# Patient Record
Sex: Female | Born: 1965 | Hispanic: No | Marital: Married | Smoking: Never smoker
Health system: Southern US, Community
[De-identification: ages and names within clinical notes are randomized; demographics above are authoritative.]

## PROBLEM LIST (undated history)

## (undated) DIAGNOSIS — I10 Essential (primary) hypertension: Secondary | ICD-10-CM

---

## 1998-06-16 ENCOUNTER — Ambulatory Visit (HOSPITAL_COMMUNITY): Admission: RE | Admit: 1998-06-16 | Discharge: 1998-06-16 | Payer: Self-pay | Admitting: Obstetrics

## 1998-06-26 ENCOUNTER — Encounter: Admission: RE | Admit: 1998-06-26 | Discharge: 1998-06-26 | Payer: Self-pay | Admitting: Obstetrics

## 1998-06-27 ENCOUNTER — Encounter: Admission: RE | Admit: 1998-06-27 | Discharge: 1998-09-25 | Payer: Self-pay | Admitting: Obstetrics & Gynecology

## 1998-07-02 ENCOUNTER — Encounter: Admission: RE | Admit: 1998-07-02 | Discharge: 1998-07-02 | Payer: Self-pay | Admitting: Obstetrics & Gynecology

## 1998-07-16 ENCOUNTER — Encounter: Admission: RE | Admit: 1998-07-16 | Discharge: 1998-07-16 | Payer: Self-pay | Admitting: Obstetrics & Gynecology

## 1998-07-21 ENCOUNTER — Ambulatory Visit (HOSPITAL_COMMUNITY): Admission: RE | Admit: 1998-07-21 | Discharge: 1998-07-21 | Payer: Self-pay | Admitting: Obstetrics & Gynecology

## 1998-07-23 ENCOUNTER — Encounter: Admission: RE | Admit: 1998-07-23 | Discharge: 1998-07-23 | Payer: Self-pay | Admitting: Obstetrics & Gynecology

## 1998-07-23 ENCOUNTER — Encounter (HOSPITAL_COMMUNITY): Admission: RE | Admit: 1998-07-23 | Discharge: 1998-10-21 | Payer: Self-pay | Admitting: Obstetrics & Gynecology

## 1998-08-06 ENCOUNTER — Encounter: Admission: RE | Admit: 1998-08-06 | Discharge: 1998-08-06 | Payer: Self-pay | Admitting: Obstetrics & Gynecology

## 1998-08-13 ENCOUNTER — Encounter: Admission: RE | Admit: 1998-08-13 | Discharge: 1998-08-13 | Payer: Self-pay | Admitting: Obstetrics & Gynecology

## 1998-08-20 ENCOUNTER — Encounter: Admission: RE | Admit: 1998-08-20 | Discharge: 1998-08-20 | Payer: Self-pay | Admitting: Obstetrics & Gynecology

## 1998-08-27 ENCOUNTER — Encounter: Admission: RE | Admit: 1998-08-27 | Discharge: 1998-08-27 | Payer: Self-pay | Admitting: Obstetrics & Gynecology

## 1998-09-03 ENCOUNTER — Inpatient Hospital Stay (HOSPITAL_COMMUNITY): Admission: AD | Admit: 1998-09-03 | Discharge: 1998-09-03 | Payer: Self-pay | Admitting: *Deleted

## 1998-09-03 ENCOUNTER — Encounter: Admission: RE | Admit: 1998-09-03 | Discharge: 1998-09-03 | Payer: Self-pay | Admitting: Obstetrics & Gynecology

## 1998-09-09 ENCOUNTER — Inpatient Hospital Stay (HOSPITAL_COMMUNITY): Admission: AD | Admit: 1998-09-09 | Discharge: 1998-09-12 | Payer: Self-pay | Admitting: Obstetrics & Gynecology

## 2002-05-24 ENCOUNTER — Encounter: Admission: RE | Admit: 2002-05-24 | Discharge: 2002-05-24 | Payer: Self-pay | Admitting: *Deleted

## 2002-05-30 ENCOUNTER — Encounter: Admission: RE | Admit: 2002-05-30 | Discharge: 2002-08-28 | Payer: Self-pay | Admitting: *Deleted

## 2002-05-31 ENCOUNTER — Encounter: Admission: RE | Admit: 2002-05-31 | Discharge: 2002-05-31 | Payer: Self-pay | Admitting: *Deleted

## 2002-05-31 ENCOUNTER — Other Ambulatory Visit: Admission: RE | Admit: 2002-05-31 | Discharge: 2002-05-31 | Payer: Self-pay | Admitting: *Deleted

## 2002-06-06 ENCOUNTER — Ambulatory Visit (HOSPITAL_COMMUNITY): Admission: RE | Admit: 2002-06-06 | Discharge: 2002-06-06 | Payer: Self-pay | Admitting: *Deleted

## 2002-06-14 ENCOUNTER — Encounter: Admission: RE | Admit: 2002-06-14 | Discharge: 2002-06-14 | Payer: Self-pay | Admitting: *Deleted

## 2002-06-28 ENCOUNTER — Encounter: Admission: RE | Admit: 2002-06-28 | Discharge: 2002-06-28 | Payer: Self-pay | Admitting: *Deleted

## 2002-07-12 ENCOUNTER — Encounter: Admission: RE | Admit: 2002-07-12 | Discharge: 2002-07-12 | Payer: Self-pay | Admitting: *Deleted

## 2002-07-26 ENCOUNTER — Encounter: Admission: RE | Admit: 2002-07-26 | Discharge: 2002-07-26 | Payer: Self-pay | Admitting: *Deleted

## 2002-07-31 ENCOUNTER — Ambulatory Visit (HOSPITAL_COMMUNITY): Admission: RE | Admit: 2002-07-31 | Discharge: 2002-07-31 | Payer: Self-pay | Admitting: *Deleted

## 2002-08-07 ENCOUNTER — Ambulatory Visit (HOSPITAL_COMMUNITY): Admission: RE | Admit: 2002-08-07 | Discharge: 2002-08-07 | Payer: Self-pay | Admitting: *Deleted

## 2002-08-09 ENCOUNTER — Encounter: Admission: RE | Admit: 2002-08-09 | Discharge: 2002-08-09 | Payer: Self-pay | Admitting: Family Medicine

## 2002-08-23 ENCOUNTER — Encounter: Admission: RE | Admit: 2002-08-23 | Discharge: 2002-08-23 | Payer: Self-pay | Admitting: Family Medicine

## 2002-09-06 ENCOUNTER — Encounter: Admission: RE | Admit: 2002-09-06 | Discharge: 2002-09-06 | Payer: Self-pay | Admitting: Family Medicine

## 2002-09-20 ENCOUNTER — Encounter: Admission: RE | Admit: 2002-09-20 | Discharge: 2002-09-20 | Payer: Self-pay | Admitting: Family Medicine

## 2002-10-11 ENCOUNTER — Encounter: Admission: RE | Admit: 2002-10-11 | Discharge: 2002-10-11 | Payer: Self-pay | Admitting: *Deleted

## 2002-10-18 ENCOUNTER — Encounter: Admission: RE | Admit: 2002-10-18 | Discharge: 2002-10-18 | Payer: Self-pay | Admitting: *Deleted

## 2002-10-25 ENCOUNTER — Encounter: Admission: RE | Admit: 2002-10-25 | Discharge: 2002-10-25 | Payer: Self-pay | Admitting: Family Medicine

## 2002-10-25 ENCOUNTER — Ambulatory Visit (HOSPITAL_COMMUNITY): Admission: RE | Admit: 2002-10-25 | Discharge: 2002-10-25 | Payer: Self-pay | Admitting: *Deleted

## 2002-11-01 ENCOUNTER — Encounter: Admission: RE | Admit: 2002-11-01 | Discharge: 2002-11-01 | Payer: Self-pay | Admitting: Family Medicine

## 2002-11-05 ENCOUNTER — Encounter: Admission: RE | Admit: 2002-11-05 | Discharge: 2002-11-05 | Payer: Self-pay | Admitting: *Deleted

## 2002-11-09 ENCOUNTER — Encounter: Admission: RE | Admit: 2002-11-09 | Discharge: 2002-11-09 | Payer: Self-pay | Admitting: Family Medicine

## 2002-11-13 ENCOUNTER — Encounter: Admission: RE | Admit: 2002-11-13 | Discharge: 2002-11-13 | Payer: Self-pay | Admitting: *Deleted

## 2002-11-15 ENCOUNTER — Encounter: Admission: RE | Admit: 2002-11-15 | Discharge: 2002-11-15 | Payer: Self-pay | Admitting: Family Medicine

## 2002-11-19 ENCOUNTER — Encounter: Admission: RE | Admit: 2002-11-19 | Discharge: 2002-11-19 | Payer: Self-pay | Admitting: *Deleted

## 2002-11-22 ENCOUNTER — Encounter: Admission: RE | Admit: 2002-11-22 | Discharge: 2002-11-22 | Payer: Self-pay | Admitting: *Deleted

## 2002-11-26 ENCOUNTER — Encounter: Admission: RE | Admit: 2002-11-26 | Discharge: 2002-11-26 | Payer: Self-pay | Admitting: *Deleted

## 2002-11-29 ENCOUNTER — Encounter: Admission: RE | Admit: 2002-11-29 | Discharge: 2002-11-29 | Payer: Self-pay | Admitting: *Deleted

## 2002-12-03 ENCOUNTER — Encounter: Admission: RE | Admit: 2002-12-03 | Discharge: 2002-12-03 | Payer: Self-pay | Admitting: *Deleted

## 2002-12-06 ENCOUNTER — Encounter: Payer: Self-pay | Admitting: *Deleted

## 2002-12-06 ENCOUNTER — Encounter: Admission: RE | Admit: 2002-12-06 | Discharge: 2002-12-06 | Payer: Self-pay | Admitting: Family Medicine

## 2002-12-06 ENCOUNTER — Inpatient Hospital Stay (HOSPITAL_COMMUNITY): Admission: AD | Admit: 2002-12-06 | Discharge: 2002-12-10 | Payer: Self-pay | Admitting: Obstetrics & Gynecology

## 2004-01-08 ENCOUNTER — Ambulatory Visit: Payer: Self-pay | Admitting: *Deleted

## 2004-01-08 ENCOUNTER — Ambulatory Visit: Payer: Self-pay | Admitting: Family Medicine

## 2004-03-03 ENCOUNTER — Ambulatory Visit: Payer: Self-pay | Admitting: Family Medicine

## 2004-03-05 ENCOUNTER — Ambulatory Visit: Payer: Self-pay | Admitting: Family Medicine

## 2004-06-22 ENCOUNTER — Ambulatory Visit: Payer: Self-pay | Admitting: Family Medicine

## 2004-08-11 ENCOUNTER — Ambulatory Visit: Payer: Self-pay | Admitting: Family Medicine

## 2004-12-29 ENCOUNTER — Ambulatory Visit: Payer: Self-pay | Admitting: Family Medicine

## 2005-07-27 ENCOUNTER — Ambulatory Visit: Payer: Self-pay | Admitting: Family Medicine

## 2005-08-02 ENCOUNTER — Ambulatory Visit: Payer: Self-pay | Admitting: Family Medicine

## 2005-08-31 ENCOUNTER — Ambulatory Visit: Payer: Self-pay | Admitting: Family Medicine

## 2005-10-12 ENCOUNTER — Ambulatory Visit: Payer: Self-pay | Admitting: Internal Medicine

## 2005-11-16 ENCOUNTER — Ambulatory Visit: Payer: Self-pay | Admitting: Family Medicine

## 2005-12-21 ENCOUNTER — Ambulatory Visit: Payer: Self-pay | Admitting: Family Medicine

## 2006-02-09 ENCOUNTER — Ambulatory Visit: Payer: Self-pay | Admitting: Family Medicine

## 2006-02-22 ENCOUNTER — Ambulatory Visit: Payer: Self-pay | Admitting: Family Medicine

## 2006-06-21 ENCOUNTER — Ambulatory Visit: Payer: Self-pay | Admitting: Family Medicine

## 2006-06-21 ENCOUNTER — Encounter (INDEPENDENT_AMBULATORY_CARE_PROVIDER_SITE_OTHER): Payer: Self-pay | Admitting: Family Medicine

## 2006-06-21 LAB — CONVERTED CEMR LAB: Pap Smear: NORMAL

## 2006-09-13 ENCOUNTER — Ambulatory Visit: Payer: Self-pay | Admitting: Family Medicine

## 2006-10-17 ENCOUNTER — Encounter (INDEPENDENT_AMBULATORY_CARE_PROVIDER_SITE_OTHER): Payer: Self-pay | Admitting: Family Medicine

## 2006-10-17 DIAGNOSIS — E1165 Type 2 diabetes mellitus with hyperglycemia: Secondary | ICD-10-CM | POA: Insufficient documentation

## 2006-10-17 DIAGNOSIS — E119 Type 2 diabetes mellitus without complications: Secondary | ICD-10-CM | POA: Insufficient documentation

## 2006-10-17 DIAGNOSIS — E785 Hyperlipidemia, unspecified: Secondary | ICD-10-CM | POA: Insufficient documentation

## 2007-01-04 ENCOUNTER — Encounter (INDEPENDENT_AMBULATORY_CARE_PROVIDER_SITE_OTHER): Payer: Self-pay | Admitting: *Deleted

## 2007-01-27 ENCOUNTER — Encounter (INDEPENDENT_AMBULATORY_CARE_PROVIDER_SITE_OTHER): Payer: Self-pay | Admitting: Family Medicine

## 2007-01-27 ENCOUNTER — Ambulatory Visit: Payer: Self-pay | Admitting: Family Medicine

## 2007-01-27 LAB — CONVERTED CEMR LAB
Albumin: 4.1 g/dL (ref 3.5–5.2)
Alkaline Phosphatase: 76 units/L (ref 39–117)
BUN: 10 mg/dL (ref 6–23)
CO2: 24 meq/L (ref 19–32)
Calcium: 9.5 mg/dL (ref 8.4–10.5)
Chloride: 103 meq/L (ref 96–112)
Glucose, Bld: 90 mg/dL (ref 70–99)
HDL: 79 mg/dL (ref >39)
LDL Cholesterol: 92 mg/dL (ref 0–99)
Lymphocytes Relative: 40 % (ref 12–46)
Lymphs Abs: 2.8 10*3/uL (ref 0.7–3.3)
MCV: 80.6 fL (ref 78.0–100.0)
Monocytes Relative: 6 % (ref 3–11)
Neutro Abs: 3.7 10*3/uL (ref 1.7–7.7)
Neutrophils Relative %: 53 % (ref 43–77)
Platelets: 355 10*3/uL (ref 150–400)
Potassium: 4 meq/L (ref 3.5–5.3)
RBC: 4.49 M/uL (ref 3.87–5.11)
Sodium: 141 meq/L (ref 135–145)
Total Protein: 8 g/dL (ref 6.0–8.3)
Triglycerides: 80 mg/dL (ref <150)
WBC: 7 10*3/uL (ref 4.0–10.5)

## 2007-04-24 ENCOUNTER — Ambulatory Visit: Payer: Self-pay | Admitting: Family Medicine

## 2007-07-24 ENCOUNTER — Ambulatory Visit: Payer: Self-pay | Admitting: Internal Medicine

## 2007-10-02 ENCOUNTER — Ambulatory Visit: Payer: Self-pay | Admitting: Internal Medicine

## 2007-10-17 ENCOUNTER — Ambulatory Visit: Payer: Self-pay | Admitting: Internal Medicine

## 2007-10-17 ENCOUNTER — Encounter (INDEPENDENT_AMBULATORY_CARE_PROVIDER_SITE_OTHER): Payer: Self-pay | Admitting: Family Medicine

## 2007-10-17 LAB — CONVERTED CEMR LAB
ALT: 9 units/L (ref 0–35)
CO2: 22 meq/L (ref 19–32)
Calcium: 9.3 mg/dL (ref 8.4–10.5)
Chloride: 99 meq/L (ref 96–112)
Cholesterol: 184 mg/dL (ref 0–200)
Creatinine, Ser: 0.49 mg/dL (ref 0.40–1.20)
Glucose, Bld: 139 mg/dL — ABNORMAL HIGH (ref 70–99)
Microalb, Ur: 3.07 mg/dL — ABNORMAL HIGH (ref 0.00–1.89)
Sodium: 136 meq/L (ref 135–145)
Total Bilirubin: 0.2 mg/dL — ABNORMAL LOW (ref 0.3–1.2)
Total Protein: 7.6 g/dL (ref 6.0–8.3)
Triglycerides: 137 mg/dL (ref <150)

## 2008-02-14 ENCOUNTER — Encounter (INDEPENDENT_AMBULATORY_CARE_PROVIDER_SITE_OTHER): Payer: Self-pay | Admitting: Adult Health

## 2008-02-14 ENCOUNTER — Ambulatory Visit: Payer: Self-pay | Admitting: Internal Medicine

## 2008-02-14 LAB — CONVERTED CEMR LAB
BUN: 11 mg/dL (ref 6–23)
CO2: 23 meq/L (ref 19–32)
Cholesterol: 227 mg/dL — ABNORMAL HIGH (ref 0–200)
Creatinine, Ser: 0.46 mg/dL (ref 0.40–1.20)
Eosinophils Absolute: 0 10*3/uL (ref 0.0–0.7)
Eosinophils Relative: 1 % (ref 0–5)
Glucose, Bld: 175 mg/dL — ABNORMAL HIGH (ref 70–99)
HCT: 35 % — ABNORMAL LOW (ref 36.0–46.0)
HDL: 77 mg/dL (ref >39)
Hemoglobin: 11.1 g/dL — ABNORMAL LOW (ref 12.0–15.0)
Lymphocytes Relative: 43 % (ref 12–46)
Lymphs Abs: 3 10*3/uL (ref 0.7–4.0)
MCV: 79.4 fL (ref 78.0–100.0)
Monocytes Absolute: 0.5 10*3/uL (ref 0.1–1.0)
Monocytes Relative: 6 % (ref 3–12)
Total Bilirubin: 0.4 mg/dL (ref 0.3–1.2)
Total CHOL/HDL Ratio: 2.9
Total Protein: 7.7 g/dL (ref 6.0–8.3)
Triglycerides: 117 mg/dL (ref <150)
VLDL: 23 mg/dL (ref 0–40)
WBC: 7 10*3/uL (ref 4.0–10.5)

## 2008-02-22 ENCOUNTER — Ambulatory Visit: Payer: Self-pay | Admitting: Internal Medicine

## 2008-03-05 ENCOUNTER — Ambulatory Visit: Payer: Self-pay | Admitting: Internal Medicine

## 2008-03-06 ENCOUNTER — Ambulatory Visit: Payer: Self-pay | Admitting: Internal Medicine

## 2008-04-17 ENCOUNTER — Encounter (INDEPENDENT_AMBULATORY_CARE_PROVIDER_SITE_OTHER): Payer: Self-pay | Admitting: Adult Health

## 2008-04-17 ENCOUNTER — Ambulatory Visit: Payer: Self-pay | Admitting: Internal Medicine

## 2008-04-17 LAB — CONVERTED CEMR LAB
ALT: <8 units/L (ref 0–35)
Albumin: 3.6 g/dL (ref 3.5–5.2)
Basophils Absolute: 0 10*3/uL (ref 0.0–0.1)
Bilirubin Urine: NEGATIVE
CO2: 20 meq/L (ref 19–32)
Calcium: 8.8 mg/dL (ref 8.4–10.5)
Chloride: 103 meq/L (ref 96–112)
Cholesterol: 194 mg/dL (ref 0–200)
Hemoglobin: 10.4 g/dL — ABNORMAL LOW (ref 12.0–15.0)
Ketones, ur: NEGATIVE mg/dL
Lymphocytes Relative: 34 % (ref 12–46)
Neutro Abs: 3.4 10*3/uL (ref 1.7–7.7)
Platelets: 335 10*3/uL (ref 150–400)
RDW: 16 % — ABNORMAL HIGH (ref 11.5–15.5)
Sodium: 135 meq/L (ref 135–145)
Specific Gravity, Urine: 1.027 (ref 1.005–1.03)
Total Protein: 7.4 g/dL (ref 6.0–8.3)
Urobilinogen, UA: 0.2 (ref 0.0–1.0)
Vit D, 1,25-Dihydroxy: 21 — ABNORMAL LOW (ref 30–89)

## 2008-04-30 ENCOUNTER — Ambulatory Visit: Payer: Self-pay | Admitting: Internal Medicine

## 2008-05-01 ENCOUNTER — Ambulatory Visit (HOSPITAL_COMMUNITY): Admission: RE | Admit: 2008-05-01 | Discharge: 2008-05-01 | Payer: Self-pay | Admitting: Family Medicine

## 2008-05-07 ENCOUNTER — Encounter: Admission: RE | Admit: 2008-05-07 | Discharge: 2008-05-07 | Payer: Self-pay | Admitting: Family Medicine

## 2008-09-03 ENCOUNTER — Ambulatory Visit: Payer: Self-pay | Admitting: Internal Medicine

## 2008-12-03 ENCOUNTER — Ambulatory Visit: Payer: Self-pay | Admitting: Internal Medicine

## 2008-12-03 ENCOUNTER — Encounter (INDEPENDENT_AMBULATORY_CARE_PROVIDER_SITE_OTHER): Payer: Self-pay | Admitting: Adult Health

## 2008-12-03 LAB — CONVERTED CEMR LAB
AST: 12 units/L (ref 0–37)
Albumin: 4 g/dL (ref 3.5–5.2)
Alkaline Phosphatase: 73 units/L (ref 39–117)
BUN: 10 mg/dL (ref 6–23)
Potassium: 3.2 meq/L — ABNORMAL LOW (ref 3.5–5.3)
RBC / HPF: NONE SEEN (ref <3)
Sodium: 136 meq/L (ref 135–145)
Total Protein: 7.6 g/dL (ref 6.0–8.3)

## 2008-12-04 ENCOUNTER — Encounter (INDEPENDENT_AMBULATORY_CARE_PROVIDER_SITE_OTHER): Payer: Self-pay | Admitting: Adult Health

## 2008-12-06 ENCOUNTER — Encounter: Admission: RE | Admit: 2008-12-06 | Discharge: 2008-12-06 | Payer: Self-pay | Admitting: Internal Medicine

## 2009-03-12 ENCOUNTER — Ambulatory Visit: Payer: Self-pay | Admitting: Internal Medicine

## 2009-03-12 ENCOUNTER — Encounter (INDEPENDENT_AMBULATORY_CARE_PROVIDER_SITE_OTHER): Payer: Self-pay | Admitting: Adult Health

## 2009-03-12 LAB — CONVERTED CEMR LAB
Albumin: 4.3 g/dL (ref 3.5–5.2)
Alkaline Phosphatase: 80 units/L (ref 39–117)
BUN: 10 mg/dL (ref 6–23)
Cholesterol: 217 mg/dL — ABNORMAL HIGH (ref 0–200)
Glucose, Bld: 185 mg/dL — ABNORMAL HIGH (ref 70–99)
HDL: 70 mg/dL (ref >39)
LDL Cholesterol: 116 mg/dL — ABNORMAL HIGH (ref 0–99)
Potassium: 4.1 meq/L (ref 3.5–5.3)
Triglycerides: 157 mg/dL — ABNORMAL HIGH (ref <150)

## 2009-05-23 ENCOUNTER — Encounter: Admission: RE | Admit: 2009-05-23 | Discharge: 2009-05-23 | Payer: Self-pay | Admitting: Internal Medicine

## 2009-06-18 ENCOUNTER — Ambulatory Visit: Payer: Self-pay | Admitting: Family Medicine

## 2009-06-18 ENCOUNTER — Encounter (INDEPENDENT_AMBULATORY_CARE_PROVIDER_SITE_OTHER): Payer: Self-pay | Admitting: Adult Health

## 2009-06-18 LAB — CONVERTED CEMR LAB
AST: 12 units/L (ref 0–37)
Alkaline Phosphatase: 80 units/L (ref 39–117)
BUN: 11 mg/dL (ref 6–23)
Basophils Absolute: 0 10*3/uL (ref 0.0–0.1)
Basophils Relative: 0 % (ref 0–1)
Creatinine, Ser: 0.41 mg/dL (ref 0.40–1.20)
Eosinophils Absolute: 0 10*3/uL (ref 0.0–0.7)
Eosinophils Relative: 0 % (ref 0–5)
Glucose, Bld: 91 mg/dL (ref 70–99)
HCT: 40.5 % (ref 36.0–46.0)
HDL: 67 mg/dL (ref >39)
LDL Cholesterol: 60 mg/dL (ref 0–99)
Lymphocytes Relative: 28 % (ref 12–46)
MCHC: 32.3 g/dL (ref 30.0–36.0)
MCV: 84.4 fL (ref 78.0–100.0)
Platelets: 336 10*3/uL (ref 150–400)
RDW: 13.1 % (ref 11.5–15.5)
Total CHOL/HDL Ratio: 2.4
Triglycerides: 158 mg/dL — ABNORMAL HIGH (ref <150)

## 2009-07-09 ENCOUNTER — Ambulatory Visit: Payer: Self-pay | Admitting: Internal Medicine

## 2009-07-30 ENCOUNTER — Ambulatory Visit: Payer: Self-pay | Admitting: Internal Medicine

## 2009-10-29 ENCOUNTER — Ambulatory Visit: Payer: Self-pay | Admitting: Internal Medicine

## 2009-10-29 ENCOUNTER — Encounter (INDEPENDENT_AMBULATORY_CARE_PROVIDER_SITE_OTHER): Payer: Self-pay | Admitting: Adult Health

## 2009-10-29 LAB — CONVERTED CEMR LAB: Microalb, Ur: 2.95 mg/dL — ABNORMAL HIGH (ref 0.00–1.89)

## 2009-11-28 ENCOUNTER — Encounter: Admission: RE | Admit: 2009-11-28 | Discharge: 2009-11-28 | Payer: Self-pay | Admitting: Internal Medicine

## 2010-01-17 ENCOUNTER — Emergency Department (HOSPITAL_COMMUNITY): Admission: EM | Admit: 2010-01-17 | Discharge: 2010-01-17 | Payer: Self-pay | Admitting: Emergency Medicine

## 2010-05-09 ENCOUNTER — Other Ambulatory Visit: Payer: Self-pay | Admitting: Internal Medicine

## 2010-05-09 DIAGNOSIS — N6320 Unspecified lump in the left breast, unspecified quadrant: Secondary | ICD-10-CM

## 2010-05-26 ENCOUNTER — Ambulatory Visit
Admission: RE | Admit: 2010-05-26 | Discharge: 2010-05-26 | Disposition: A | Discharge status: Home / Self Care | Payer: Medicaid Other | Source: Ambulatory Visit | Attending: Internal Medicine | Admitting: Internal Medicine

## 2010-05-26 DIAGNOSIS — N6320 Unspecified lump in the left breast, unspecified quadrant: Secondary | ICD-10-CM

## 2010-06-09 ENCOUNTER — Emergency Department (HOSPITAL_COMMUNITY)
Admission: EM | Admit: 2010-06-09 | Discharge: 2010-06-10 | Disposition: A | Discharge status: Home / Self Care | Payer: Medicaid Other | Attending: Emergency Medicine | Admitting: Emergency Medicine

## 2010-06-09 DIAGNOSIS — R509 Fever, unspecified: Secondary | ICD-10-CM | POA: Insufficient documentation

## 2010-06-09 DIAGNOSIS — Z79899 Other long term (current) drug therapy: Secondary | ICD-10-CM | POA: Insufficient documentation

## 2010-06-09 DIAGNOSIS — R109 Unspecified abdominal pain: Secondary | ICD-10-CM | POA: Insufficient documentation

## 2010-06-09 DIAGNOSIS — E119 Type 2 diabetes mellitus without complications: Secondary | ICD-10-CM | POA: Insufficient documentation

## 2010-06-09 DIAGNOSIS — N12 Tubulo-interstitial nephritis, not specified as acute or chronic: Secondary | ICD-10-CM | POA: Insufficient documentation

## 2010-06-09 DIAGNOSIS — Z794 Long term (current) use of insulin: Secondary | ICD-10-CM | POA: Insufficient documentation

## 2010-06-09 DIAGNOSIS — I10 Essential (primary) hypertension: Secondary | ICD-10-CM | POA: Insufficient documentation

## 2010-06-09 DIAGNOSIS — E7800 Pure hypercholesterolemia, unspecified: Secondary | ICD-10-CM | POA: Insufficient documentation

## 2010-06-09 LAB — DIFFERENTIAL
Basophils Absolute: 0 K/uL (ref 0.0–0.1)
Basophils Relative: 0 % (ref 0–1)
Eosinophils Absolute: 0 K/uL (ref 0.0–0.7)
Eosinophils Relative: 0 % (ref 0–5)
Lymphocytes Relative: 17 % (ref 12–46)
Lymphs Abs: 1.8 K/uL (ref 0.7–4.0)
Monocytes Absolute: 0.9 K/uL (ref 0.1–1.0)
Monocytes Relative: 8 % (ref 3–12)
Neutro Abs: 8.3 K/uL — ABNORMAL HIGH (ref 1.7–7.7)
Neutrophils Relative %: 75 % (ref 43–77)

## 2010-06-09 LAB — CBC
HCT: 39 % (ref 36.0–46.0)
MCHC: 33.1 g/dL (ref 30.0–36.0)
MCV: 84.4 fL (ref 78.0–100.0)
Platelets: 253 10*3/uL (ref 150–400)
RDW: 12.7 % (ref 11.5–15.5)
WBC: 11 10*3/uL — ABNORMAL HIGH (ref 4.0–10.5)

## 2010-06-09 LAB — URINALYSIS, ROUTINE W REFLEX MICROSCOPIC
Bilirubin Urine: NEGATIVE
Nitrite: NEGATIVE
Protein, ur: 100 mg/dL — AB
Specific Gravity, Urine: 1.012 (ref 1.005–1.030)
Urine Glucose, Fasting: NEGATIVE mg/dL
Urobilinogen, UA: 1 mg/dL (ref 0.0–1.0)
pH: 6.5 (ref 5.0–8.0)

## 2010-06-09 LAB — COMPREHENSIVE METABOLIC PANEL
Albumin: 3.5 g/dL (ref 3.5–5.2)
Alkaline Phosphatase: 81 U/L (ref 39–117)
BUN: 6 mg/dL (ref 6–23)
Calcium: 9.1 mg/dL (ref 8.4–10.5)
Glucose, Bld: 95 mg/dL (ref 70–99)
Potassium: 3.5 mEq/L (ref 3.5–5.1)
Total Protein: 8 g/dL (ref 6.0–8.3)

## 2010-06-09 LAB — POCT PREGNANCY, URINE: Preg Test, Ur: NEGATIVE

## 2010-06-09 LAB — URINE MICROSCOPIC-ADD ON

## 2010-06-11 LAB — URINE CULTURE: Colony Count: >=100000

## 2011-06-01 ENCOUNTER — Other Ambulatory Visit: Payer: Self-pay | Admitting: Diagnostic Radiology

## 2011-06-01 DIAGNOSIS — Z1231 Encounter for screening mammogram for malignant neoplasm of breast: Secondary | ICD-10-CM

## 2011-06-30 ENCOUNTER — Ambulatory Visit
Admission: RE | Admit: 2011-06-30 | Discharge: 2011-06-30 | Disposition: A | Discharge status: Home / Self Care | Payer: Self-pay | Source: Ambulatory Visit | Attending: Diagnostic Radiology | Admitting: Diagnostic Radiology

## 2011-06-30 DIAGNOSIS — Z1231 Encounter for screening mammogram for malignant neoplasm of breast: Secondary | ICD-10-CM

## 2011-07-06 ENCOUNTER — Encounter (HOSPITAL_COMMUNITY): Payer: Self-pay

## 2011-07-06 ENCOUNTER — Emergency Department (INDEPENDENT_AMBULATORY_CARE_PROVIDER_SITE_OTHER)
Admission: EM | Admit: 2011-07-06 | Discharge: 2011-07-06 | Disposition: A | Discharge status: Home / Self Care | Payer: Self-pay | Source: Home / Self Care | Attending: Emergency Medicine | Admitting: Emergency Medicine

## 2011-07-06 DIAGNOSIS — G510 Bell's palsy: Secondary | ICD-10-CM

## 2011-07-06 MED ORDER — PREDNISONE 5 MG PO KIT: 1.0000 | PACK | Freq: Every day | ORAL | Status: DC

## 2011-07-06 MED ORDER — ACYCLOVIR 400 MG PO TABS: 800.0000 mg | ORAL_TABLET | ORAL | Status: AC

## 2011-07-06 MED ORDER — POLYETHYL GLYCOL-PROPYL GLYCOL 0.4-0.3 % OP SOLN: 1.0000 [drp] | OPHTHALMIC | Status: DC

## 2011-07-06 MED ORDER — ERYTHROMYCIN 5 MG/GM OP OINT: TOPICAL_OINTMENT | OPHTHALMIC | Status: AC

## 2011-07-06 NOTE — ED Provider Notes (Signed)
Chief Complaint  Patient presents with  . Mouth Injury    History of Present Illness:   The patient is a 46 year old female with diabetes and hypertension. Yesterday she noted that her left eye was tearing. Today she noted the right corner of her mouth was drawn. She denies any pain in her face, numbness, tingling, diplopia, or blurred vision she has no pain in the ear or behind the ear. She is not aware of any weakness of the left side of the face. She denies any eye pain or redness. She has no numbness, tingling, or weakness of her arms or legs. Her speech is normal and she is able to ambulate normally. She denies any headache or stiff neck.  Review of Systems:  Other than noted above, the patient denies any of the following symptoms: Systemic:  No fever, chills, fatigue, photophobia, stiff neck. Eye:  No redness, eye pain, discharge, blurred vision, or diplopia. ENT:  No nasal congestion, rhinorrhea, sinus pressure or pain, sneezing, earache, or sore throat.  No jaw claudication. Neuro:  No paresthesias, loss of consciousness, seizure activity, muscle weakness, trouble with coordination or gait, trouble speaking or swallowing. Psych:  No depression, anxiety or trouble sleeping.  PMFSH:  Past medical history, family history, social history, meds, and allergies were reviewed.  Physical Exam:   Vital signs:  BP 128/75  Pulse 99  Temp(Src) 98.9 F (37.2 C) (Oral)  Resp 16  SpO2 97% General:  Alert and oriented.  In no distress. Eye:  Lids and conjunctivas normal.  PERRL,  Full EOMs.  Fundi benign with normal discs and vessels. ENT:  No cranial or facial tenderness to palpation.  TMs and canals clear.  Nasal mucosa was normal and uncongested without any drainage. No intra oral lesions, pharynx clear, mucous membranes moist, dentition normal. Neck:  Supple, full ROM, no tenderness to palpation.  No adenopathy or mass. Neuro:  Alert and orented times 3.  Speech was clear, fluent, and  appropriate.  Cranial nerve exam reveals mild weakness of the left facial nerve. She is unable to elevate her eyebrow. She is able to completely close her eye, but strength of the orbicularis oculi muscle is diminished. She has drooping of the left corner of the mouth. No pronator drift, muscle strength normal. Finger to nose normal.  DTRs 2+ .Station and gait were normal.  Romberg's sign was normal.  Able to perform tandem gait well. Psych:  Normal affect.  Assessment:   Diagnoses that have been ruled out:  None  Diagnoses that are still under consideration:  None  Final diagnoses:  Bell's palsy    Plan:   1.  The following meds were prescribed:   New Prescriptions   ACYCLOVIR (ZOVIRAX) 400 MG TABLET    Take 2 tablets (800 mg total) by mouth every 4 (four) hours while awake.   ERYTHROMYCIN OPHTHALMIC OINTMENT    Place a 1/2 inch ribbon of ointment into the lower eyelid at HS.   POLYETHYL GLYCOL-PROPYL GLYCOL (SYSTANE) 0.4-0.3 % SOLN    Apply 1 drop to eye every 3 (three) hours.   PREDNISONE 5 MG KIT    Take 1 kit (5 mg total) by mouth daily after breakfast. Prednisone 5 mg 6 day dosepack.  Take as directed.   2.  The patient was instructed in symptomatic care and handouts were given. 3.  The patient was told to return if becoming worse in any way, if no better in 3 or 4 days, and given  some red flag symptoms that would indicate earlier return.  Follow up:  The patient was told to follow up either here or with [facility] in one week. She was warned that the prednisone might cause her glucose to go up transiently.     Reuben Likes, MD 07/06/11 1245

## 2011-07-06 NOTE — Discharge Instructions (Signed)
Bell's Palsy  Bell's palsy is a condition in which the muscles on one side of the face cannot move (paralysis). This is because the nerves in the face are paralyzed. It is most often thought to be caused by a virus. The virus causes swelling of the nerve that controls movement on one side of the face. The nerve travels through a tight space surrounded by bone. When the nerve swells, it can be compressed by the bone. This results in damage to the protective covering around the nerve. This damage interferes with how the nerve communicates with the muscles of the face. As a result, it can cause weakness or paralysis of the facial muscles.   Injury (trauma), tumor, and surgery may cause Bell's palsy, but most of the time the cause is unknown. It is a relatively common condition. It starts suddenly (abrupt onset) with the paralysis usually ending within 2 days. Bell's palsy is not dangerous. But because the eye does not close properly, you may need care to keep the eye from getting dry. This can include splinting (to keep the eye shut) or moistening with artificial tears. Bell's palsy very seldom occurs on both sides of the face at the same time.  SYMPTOMS    Eyebrow sagging.   Drooping of the eyelid and corner of the mouth.   Inability to close one eye.   Loss of taste on the front of the tongue.   Sensitivity to loud noises.  TREATMENT   The treatment is usually non-surgical. If the patient is seen within the first 24 to 48 hours, a short course of steroids may be prescribed, in an attempt to shorten the length of the condition. Antiviral medicines may also be used with the steroids, but it is unclear if they are helpful.   You will need to protect your eye, if you cannot close it. The cornea (clear covering over your eye) will become dry and can be damaged. Artificial tears can be used to keep your eye moist. Glasses or an eye patch should be worn to protect your eye.  PROGNOSIS   Recovery is variable, ranging  from days to months. Although the problem usually goes away completely (about 80% of cases resolve), predicting the outcome is impossible. Most people improve within 3 weeks of when the symptoms began. Improvement may continue for 3 to 6 months. A small number of people have moderate to severe weakness that is permanent.   HOME CARE INSTRUCTIONS    If your caregiver prescribed medication to reduce swelling in the nerve, use as directed. Do not stop taking the medication unless directed by your caregiver.   Use moisturizing eye drops as needed to prevent drying of your eye, as directed by your caregiver.   Protect your eye, as directed by your caregiver.   Use facial massage and exercises, as directed by your caregiver.   Perform your normal activities, and get your normal rest.  SEEK IMMEDIATE MEDICAL CARE IF:    There is pain, redness or irritation in the eye.   You or your child has an oral temperature above 102 F (38.9 C), not controlled by medicine.  MAKE SURE YOU:    Understand these instructions.   Will watch your condition.   Will get help right away if you are not doing well or get worse.  Document Released: 04/05/2005 Document Revised: 03/25/2011 Document Reviewed: 04/14/2009  [employer] Patient Information 2012 [employer].

## 2011-07-06 NOTE — ED Notes (Addendum)
Patient c/o of shifting of jaw on right side which started 3/18 while she was brushing her teeth. Patient also states that her left eye is draining clear fluid; when patient wakes up her right is crusted shut.on 3/18 . Note by Corey Skains, RN

## 2011-07-12 ENCOUNTER — Emergency Department (INDEPENDENT_AMBULATORY_CARE_PROVIDER_SITE_OTHER)
Admission: EM | Admit: 2011-07-12 | Discharge: 2011-07-12 | Disposition: A | Discharge status: Home / Self Care | Payer: Self-pay | Source: Home / Self Care | Attending: Emergency Medicine | Admitting: Emergency Medicine

## 2011-07-12 ENCOUNTER — Encounter (HOSPITAL_COMMUNITY): Payer: Self-pay | Admitting: Emergency Medicine

## 2011-07-12 DIAGNOSIS — G510 Bell's palsy: Secondary | ICD-10-CM

## 2011-07-12 HISTORY — DX: Essential (primary) hypertension: I10

## 2011-07-12 NOTE — Discharge Instructions (Signed)
Continue taking the acyclovir. Continue the Systane and the erythromycin eye ointment at night to prevent her from drying out. You will need to see a neurologist within the next week or 2, as you may need physical therapy to regain full function. Most often, this takes 3 weeks to resolve. Sometimes people have residual deficits, but most get better with time. There is a small chance that this may come back within the next 10 years. Return if you get worse, if you have trouble talking, walking, a headache, or any other concerns.

## 2011-07-12 NOTE — ED Notes (Signed)
Medical follow-up request form received from Dr. Chaney Malling for pt. to be seen by [facility] for Bell's Palsy. Pt. finished medication and still moderate dysfuntion.  She thinks pt. might benefit from PT.  Chart and referral request faxed to [phone] and confirmation received. Karla Mack 07/12/2011

## 2011-07-12 NOTE — ED Provider Notes (Signed)
History     CSN: [id number]  Arrival date & time 07/12/11  1032   First MD Initiated Contact with Patient 07/12/11 1211      Chief Complaint  Patient presents with  . Follow-up    (Consider location/radiation/quality/duration/timing/severity/associated sxs/prior treatment) HPI Comments: Patient was seen here 6 days ago for symptoms consistent with Bell's palsy. Was sent home with a acyclovir, Systane, erythromycin ophthalmic ointment, and prednisone taper. She was told to followup with [facility] or here. Patient states that the symptoms have not gotten any worse, but they have not gotten any better, even though she finished the steroids, and is taking acyclovir. States she is using Systane, and the erythromycin ophthalmic ointment at night to prevent drying of her eye. Patient complains of continued  inability to raise her left eyebrow, inability to smile on the left side. No facial numbness, dysarthria, ear pain, change in hearing, facial rash, blurry vision, eye pain or redness headache, fevers, stiff neck. No arm, leg weakness. Does not recall a tick bite, and denies history of syphilis. The spouse interpreted for the patient.  ROS as noted in HPI. All other ROS negative.  The history is provided by the spouse and the patient. The history is limited by a language barrier. A language interpreter was used.    Past Medical History  Diagnosis Date  . Diabetes mellitus   . Hypertension     History reviewed. No pertinent past surgical history.  History reviewed. No pertinent family history.  History  Substance Use Topics  . Smoking status: Never Smoker   . Smokeless tobacco: Not on file  . Alcohol Use: No    OB History    Grav Para Term Preterm Abortions TAB SAB Ect Mult Living                  Review of Systems  Allergies  Seasonal  Home Medications   Current Outpatient Rx  Name Route Sig Dispense Refill  . ACYCLOVIR 400 MG PO TABS Oral Take 2 tablets (800 mg  total) by mouth every 4 (four) hours while awake. 100 tablet 0  . AMLODIPINE BESYLATE 10 MG PO TABS Oral Take 10 mg by mouth daily.    . ASPIRIN 81 MG PO TABS Oral Take 81 mg by mouth daily.    Marland Kitchen CETIRIZINE HCL 10 MG PO TABS Oral Take 10 mg by mouth daily.    . ERGOCALCIFEROL 50000 UNITS PO CAPS Oral Take 50,000 Units by mouth once a week.    . ERYTHROMYCIN 5 MG/GM OP OINT  Place a 1/2 inch ribbon of ointment into the lower eyelid at HS. 3.5 g 0  . HYDROXYZINE HCL 25 MG PO TABS Oral Take 25 mg by mouth 3 (three) times daily as needed.    . INSULIN ASPART 100 UNIT/ML [place] SOLN Subcutaneous Inject into the skin 3 (three) times daily before meals.    Marland Kitchen METFORMIN HCL 1000 MG PO TABS Oral Take 1,000 mg by mouth 2 (two) times daily with a meal.    . POLYETHYL GLYCOL-PROPYL GLYCOL 0.4-0.3 % OP SOLN Ophthalmic Apply 1 drop to eye every 3 (three) hours. 10 mL 0  . PRAVASTATIN SODIUM 40 MG PO TABS Oral Take 40 mg by mouth daily.    Marland Kitchen PREDNISONE 5 MG PO KIT Oral Take 1 kit (5 mg total) by mouth daily after breakfast. Prednisone 5 mg 6 day dosepack.  Take as directed. 1 kit 0    BP 132/76  Pulse 87  Temp(Src) 99.4 F (37.4 C) (Oral)  Resp 14  SpO2 100%  LMP 07/10/2011  Physical Exam  Nursing note and vitals reviewed. Constitutional: She is oriented to person, place, and time. She appears well-developed and well-nourished. No distress.  HENT:  Head: Normocephalic and atraumatic.  Eyes: Conjunctivae and EOM are normal.  Neck: Normal range of motion.  Cardiovascular: Normal rate.   Pulmonary/Chest: Effort normal.  Abdominal: She exhibits no distension.  Musculoskeletal: Normal range of motion.  Neurological: She is alert and oriented to person, place, and time. A cranial nerve deficit is present. No sensory deficit. She displays a negative Romberg sign.       Forehead: Moderate movement. Able to close eye completely with effort. Left-sided mouth: Slightly weak but maximum effort. No tongue  deviation. Rest of cranial nerves within normal limits. Strength upper, lower extremities 5 out of 5 bilaterally. Tandem gait steady. Romberg normal.  Skin: Skin is warm and dry.  Psychiatric: She has a normal mood and affect. Her behavior is normal. Judgment and thought content normal.    ED Course  Procedures (including critical care time)  Labs Reviewed - No data to display No results found.   1. Bell's palsy       MDM  Previous records viewed. As noted in history of present illness. Patient has grade 3 indicating moderate dysfunction on the House-Brackman classification scale. Discussed with patient that this may take up to 3 weeks to fully resolve, however, will refer her to neurology so that she can have physical therapy, if needed. Will have her continue the Systane, erythromycin eye ointment, and the acyclovir. Discussed plan with patient and spouse, who agree with plan. As patient has no [place], will have Cherly Anderson assist in making referral. [facility] on call.  Luiz Blare, MD 07/12/11 [phone]

## 2011-07-12 NOTE — ED Notes (Signed)
Pt. Speaks Arabic. Family at bedside interpreting. Pt. Here for follow up on dx of Bells Palsy last week. Pt. C/o that therapy has not helped yet. Denies pain.

## 2011-07-19 ENCOUNTER — Ambulatory Visit (INDEPENDENT_AMBULATORY_CARE_PROVIDER_SITE_OTHER): Payer: Self-pay | Admitting: Neurology

## 2011-07-19 ENCOUNTER — Encounter: Payer: Self-pay | Admitting: Neurology

## 2011-07-19 ENCOUNTER — Other Ambulatory Visit (INDEPENDENT_AMBULATORY_CARE_PROVIDER_SITE_OTHER): Payer: Self-pay

## 2011-07-19 VITALS — BP 128/84 | HR 96 | Ht 65.0 in | Wt 161.0 lb

## 2011-07-19 DIAGNOSIS — G510 Bell's palsy: Secondary | ICD-10-CM

## 2011-07-19 LAB — CBC WITH DIFFERENTIAL/PLATELET
Basophils Absolute: 0 10*3/uL (ref 0.0–0.1)
Eosinophils Absolute: 0 10*3/uL (ref 0.0–0.7)
HCT: 37.8 % (ref 36.0–46.0)
Hemoglobin: 12.4 g/dL (ref 12.0–15.0)
Lymphocytes Relative: 32 % (ref 12.0–46.0)
Lymphs Abs: 2.3 10*3/uL (ref 0.7–4.0)
MCHC: 32.8 g/dL (ref 30.0–36.0)
Monocytes Absolute: 0.5 10*3/uL (ref 0.1–1.0)
Neutro Abs: 4.4 10*3/uL (ref 1.4–7.7)
RDW: 14.9 % — ABNORMAL HIGH (ref 11.5–14.6)

## 2011-07-19 LAB — COMPREHENSIVE METABOLIC PANEL
ALT: 17 U/L (ref 0–35)
AST: 20 U/L (ref 0–37)
Alkaline Phosphatase: 87 U/L (ref 39–117)
Creatinine, Ser: 0.4 mg/dL (ref 0.4–1.2)
Total Bilirubin: 0.2 mg/dL — ABNORMAL LOW (ref 0.3–1.2)

## 2011-07-19 NOTE — Patient Instructions (Addendum)
Go to the basement to have your labs drawn today.  You can try some over the counter hydrocortisone cream for your rash, if it does not help please contact your primary care doctor.

## 2011-07-19 NOTE — Progress Notes (Signed)
Thank you for having me see Karla Mack in consultation today at [facility] for her problem with left-sided facial paresis.  As you may recall, she is a 46 y.o. year old female with a history of diabetes and hypertension who presented 3 weeks ago with both upper and lower facial paresis on the left.  It came on over night(although our history is limited by translation through her husband). It was felt that this represented Bell's palsy. She was given a course of steroids and acyclovir of which she is still on the acyclovir. She's had a marked improvement. She does not endorse any eye irritation. She is able to close her eye fully. She's had no fevers chills or night sweats. There is no rash on her face. She had no disturbance of hearing or taste.  She has been using erythromycin ointment at night as well as eye drops during the day.  She did not have any pain with this episode. There was no double vision.  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 The actual history this is a little bit difficult to obtain. The patient says she was born with it while her husband said she got it from an immunization at 68 months of age.  Surgical History: No surgeries  Social History: No tobacco or alcohol use. She is originally from [place].  Family History: No significant neurologic family history  Medications: Acyclovir, amlodipine, aspirin, metformin, insulin, pravastatin.  Allergies  Allergen Reactions  . Seasonal     Itchy eyes, runny nose, sneezing      Review of systems:  13 systems were reviewed and are notable for right leg weakness and atrophy.  All other review of systems are unremarkable.   Examination:  Filed Vitals:   07/19/11 1320  BP: 128/84  Pulse: 96  Height: 5\' 5"  (1.651 m)  Weight: 161 lb (73.029 kg)     In general, well appearing women  Cardiovascular: The patient has a regular rate and rhythm and no carotid  bruits.  Fundoscopy:  Disks are flat. Vessel caliber within normal limits.  Mental status:   The patient is oriented to person, place and time. Recent and remote memory are intact. Attention span and concentration are normal. Language including repetition, naming, following commands are intact. Fund of knowledge of current and historical events, as well as vocabulary are normal.  Cranial Nerves: Pupils are equally round and reactive to light. Visual fields full to confrontation. Extraocular movements are intact without nystagmus, versions full. Facial sensation and muscles of mastication are intact. Mild weakness of her left lower face.  Can close her left eye, but not ask brisk as the right. Hearing intact to bilateral finger rub. Tongue protrusion, uvula, palate midline.  Shoulder shrug intact  Motor:  The patient has atrophy of her right lower leg. There are no adventitious movements.  5/5 muscle strength bilaterally.  Reflexes:   Biceps  Triceps Brachioradialis Knee Ankle  Right 1+  1+  1+   0 0  Left  1+  1+  1+   0 0  Right toe up, left toe down(or equivocal).  Coordination:  Normal finger to nose.  No dysdiadokinesia.  Sensation is intact to temperature.  Gait and Station are notable for outturned ankle, especially on the right..   Romberg is negative  Impression and Recommendations: 1.  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  No sign of parenchymal involvement on  exam(no sign of a sixth nerve palsy).  No symptoms of systemic illness.  The improvement and definite signs of LMN make me less concerned about a possible central ischemic cause, although I would feel more comfortable if I had a better history.  I am going to get blood work, including CBC, CMP, Lyme, HIV, ESR and CRP.  She can stop the acyclovir but continue to use the eye drops PRN.  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  I am also going to get an MRI brain to assess her brainstem particularly for  ischemic stroke, given her history of diabetes - especially since she has an upgoing toe on the right(which may be old). 2.  Right lower leg atrophy and up going toe on the same side.  I suspect these are related to her congenital paresis.  We will see the patient back in 2 months.  Thank you for having [place] see Karla Mack in consultation.  Feel free to contact me with any questions.  Lupita Raider Modesto Charon, MD [facility], [place] [address] [place], [place] [address] Phone: [phone] Fax: [phone].

## 2011-07-21 ENCOUNTER — Other Ambulatory Visit: Payer: Self-pay

## 2011-07-21 DIAGNOSIS — G510 Bell's palsy: Secondary | ICD-10-CM

## 2011-07-21 LAB — B. BURGDORFI ANTIBODIES BY WB
B burgdorferi IgG Abs (IB): NEGATIVE
B burgdorferi IgM Abs (IB): NEGATIVE

## 2011-07-26 ENCOUNTER — Ambulatory Visit (HOSPITAL_COMMUNITY)
Admission: RE | Admit: 2011-07-26 | Discharge: 2011-07-26 | Disposition: A | Discharge status: Home / Self Care | Payer: Self-pay | Source: Ambulatory Visit | Attending: Neurology | Admitting: Neurology

## 2011-07-26 DIAGNOSIS — G510 Bell's palsy: Secondary | ICD-10-CM | POA: Insufficient documentation

## 2011-07-30 ENCOUNTER — Telehealth: Payer: Self-pay | Admitting: Neurology

## 2011-07-30 NOTE — Progress Notes (Signed)
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  No stroke in the brainstem, thus supporting this is a Bell's palsy.

## 2011-07-30 NOTE — Telephone Encounter (Signed)
Message copied by Benay Spice on Fri Jul 30, 2011 12:06 PM ------      Message from: Milas Gain      Created: Fri Jul 30, 2011 11:17 AM       let ms faynoush know her mri looked ok.

## 2011-07-30 NOTE — Telephone Encounter (Signed)
Called and spoke with the patient. Information given as directed by Dr. Modesto Charon re: MRI brian unremarkable. No other concerns voiced at this time.

## 2011-08-14 IMAGING — US US BREAST*L*
1 series · 5 of 5 positions shown · non-contrast
Comparison: With priors

CLINICAL DATA: Short-term interval follow-up of a probable benign
lesion in the left breast

DIGITAL DIAGNOSTIC BILATERAL MAMMOGRAM WITH CAD AND LEFT BREAST
ULTRASOUND:

[Series 1: us breast*left* · 5 of 5 slices shown]
[im 1/5]
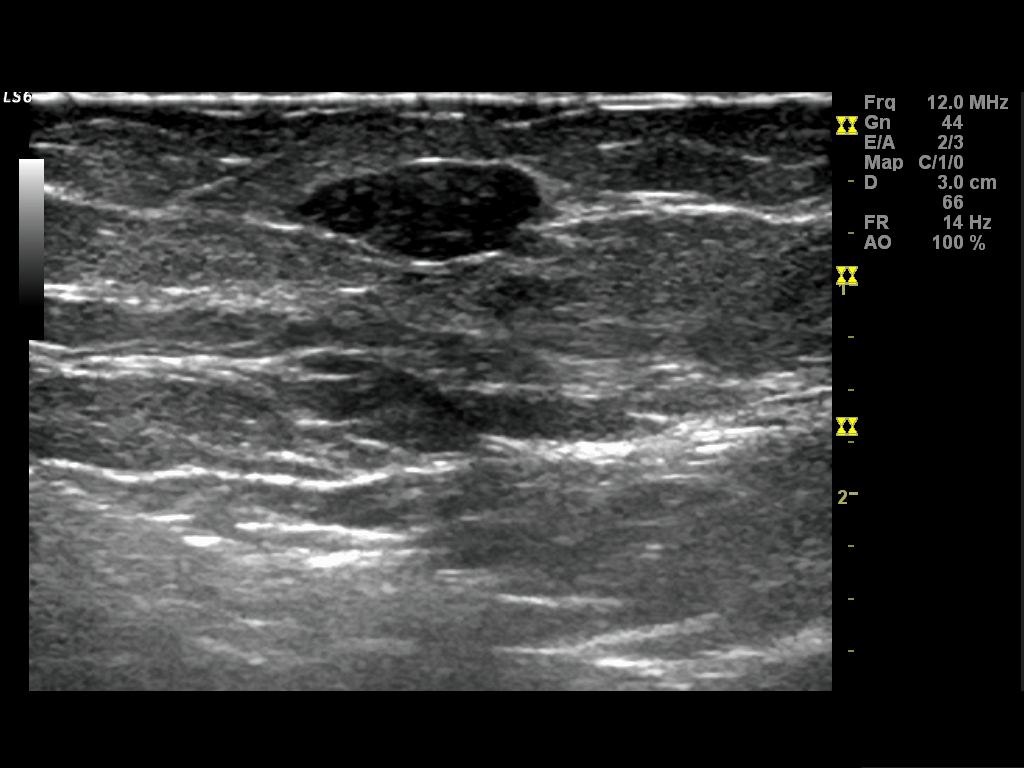
[im 2/5]
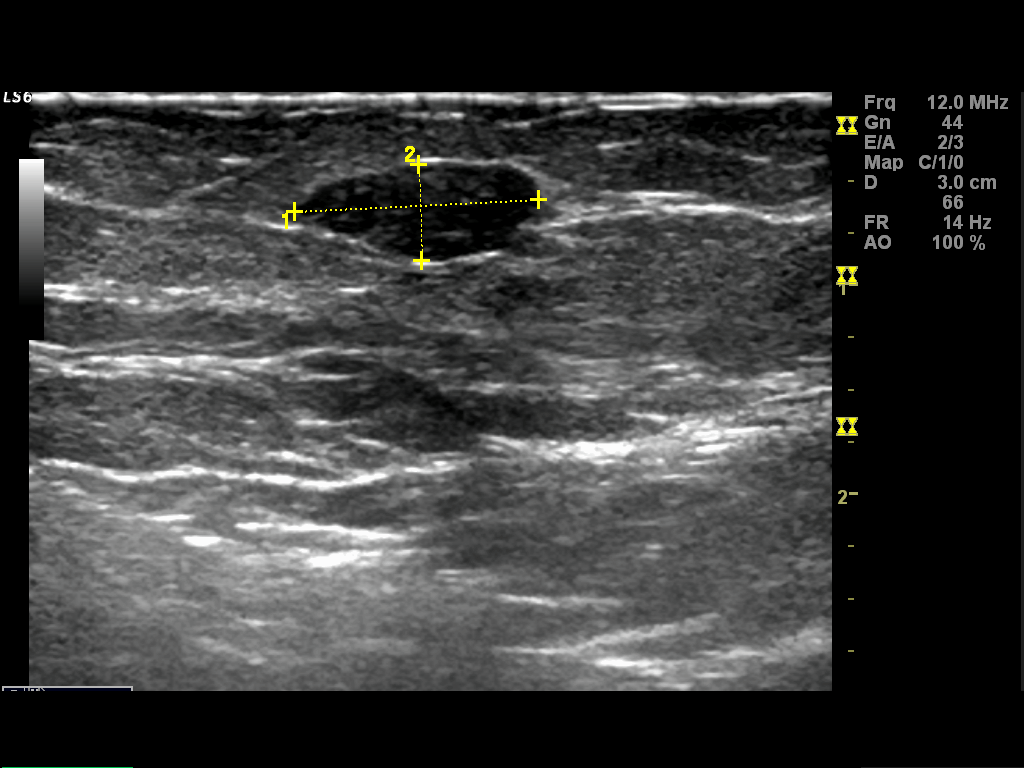
[im 3/5]
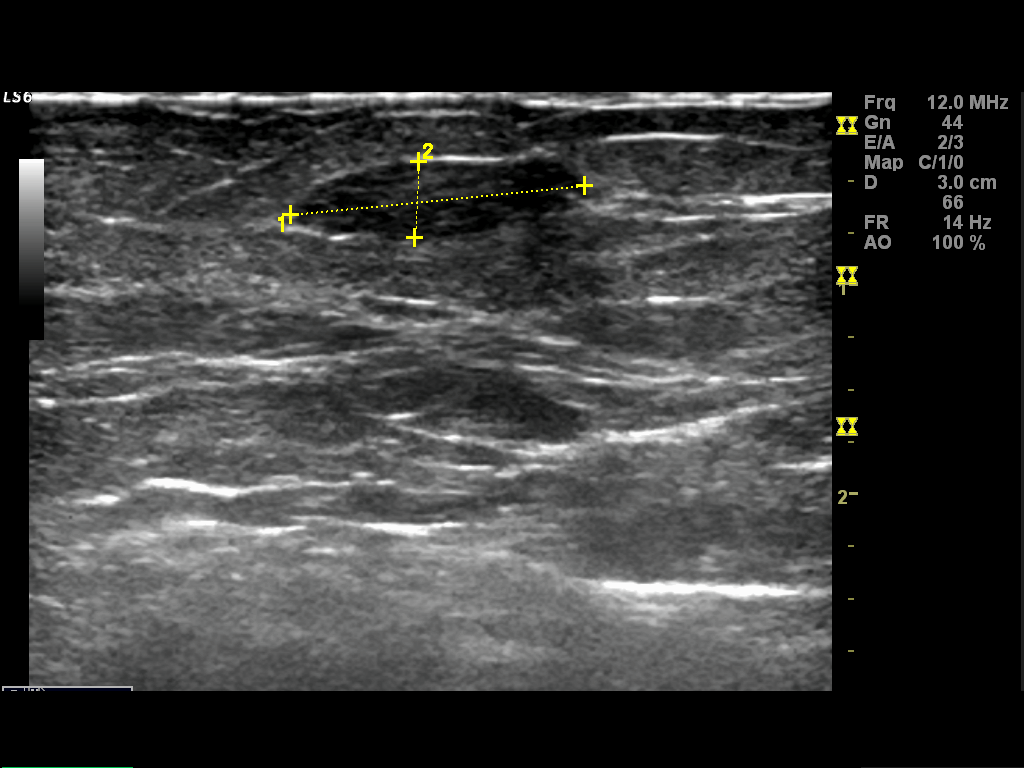
[im 4/5]
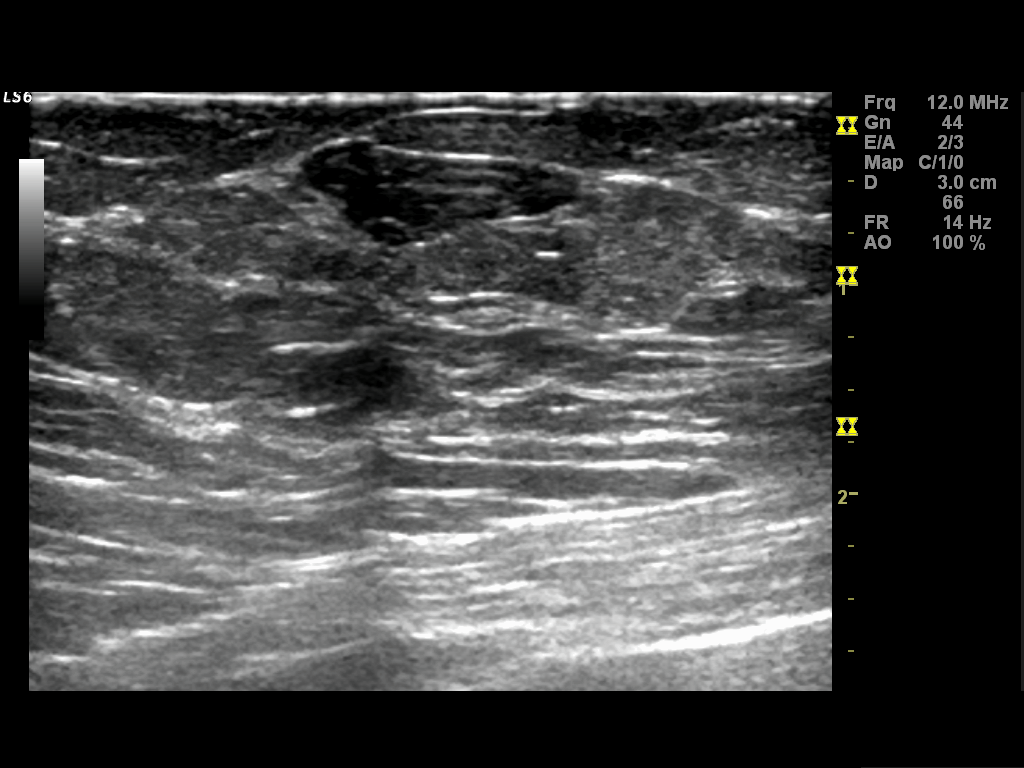
[im 5/5]
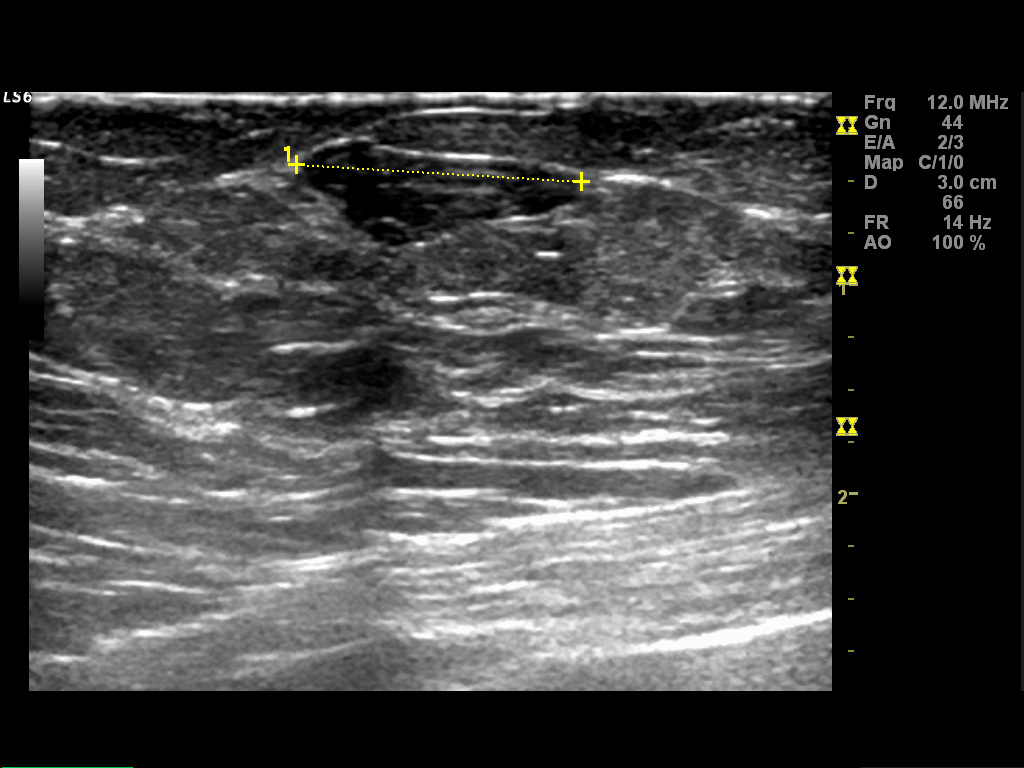

[5 of 5 positions shown; findings below may reference images not displayed]

FINDINGS: There are scattered fibroglandular densities.  No new
suspicious mass or malignant-type microcalcifications identified in
either breast.  The low density mass in the medial aspect of the
left breast is unchanged when compared to the prior mammogram dated
05/01/2008.
Mammographic images were processed with CAD.

On physical exam, I do not palpate a discrete mass in the left
breast.

Ultrasound is performed, showing there is a stable, hypoechoic,
well-circumscribed mass in the left breast at 10-11 o'clock 10 cm
from the nipple measuring 1.4 x 0.4 x 1.4 cm.  On the prior
ultrasound dated 12/06/2008 it measures 1.4 x 0.4 x 1.6 cm.
IMPRESSION: Stable probable benign fibroadenoma in the left breast.  Short-term
interval follow-up left breast ultrasound in 6 months is
recommended.   If the lesion is stable at that time I would
recommend an additional bilateral diagnostic mammogram and left
breast ultrasound in another 6 months to complete the 2-year follow-
up.

BI-RADS CATEGORY 3:  Probably benign finding(s) - short interval
follow-up suggested.

## 2011-08-14 IMAGING — MG MM DIGITAL DIAGNOSTIC BILAT
4 series · 4 of 4 positions shown · non-contrast
Comparison: With priors

CLINICAL DATA: Short-term interval follow-up of a probable benign
lesion in the left breast

DIGITAL DIAGNOSTIC BILATERAL MAMMOGRAM WITH CAD AND LEFT BREAST
ULTRASOUND:

[R CC]
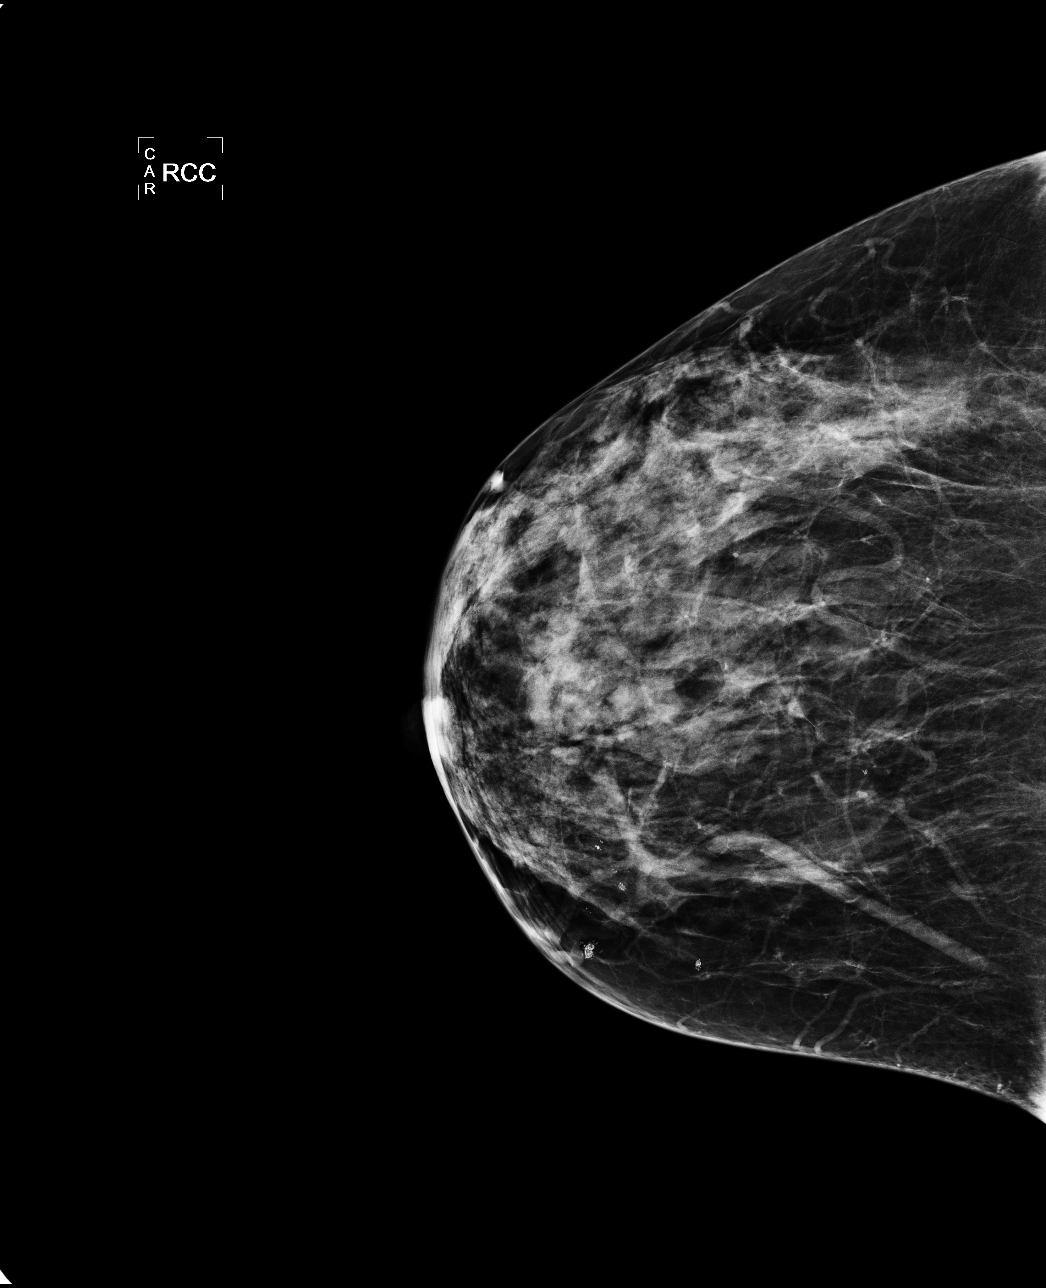

[L CC]
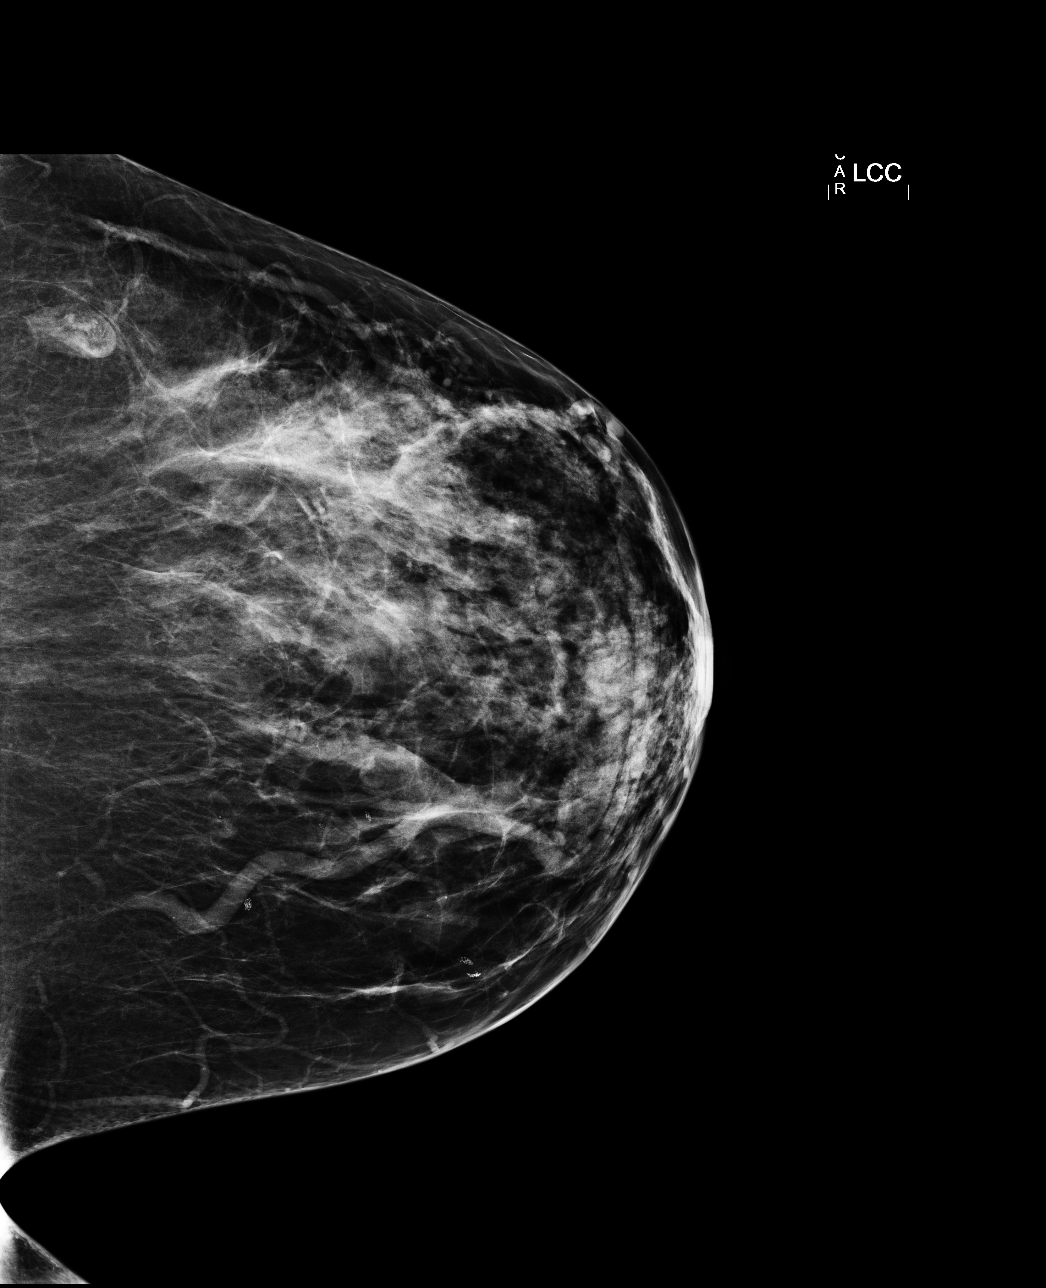

[L MLO]
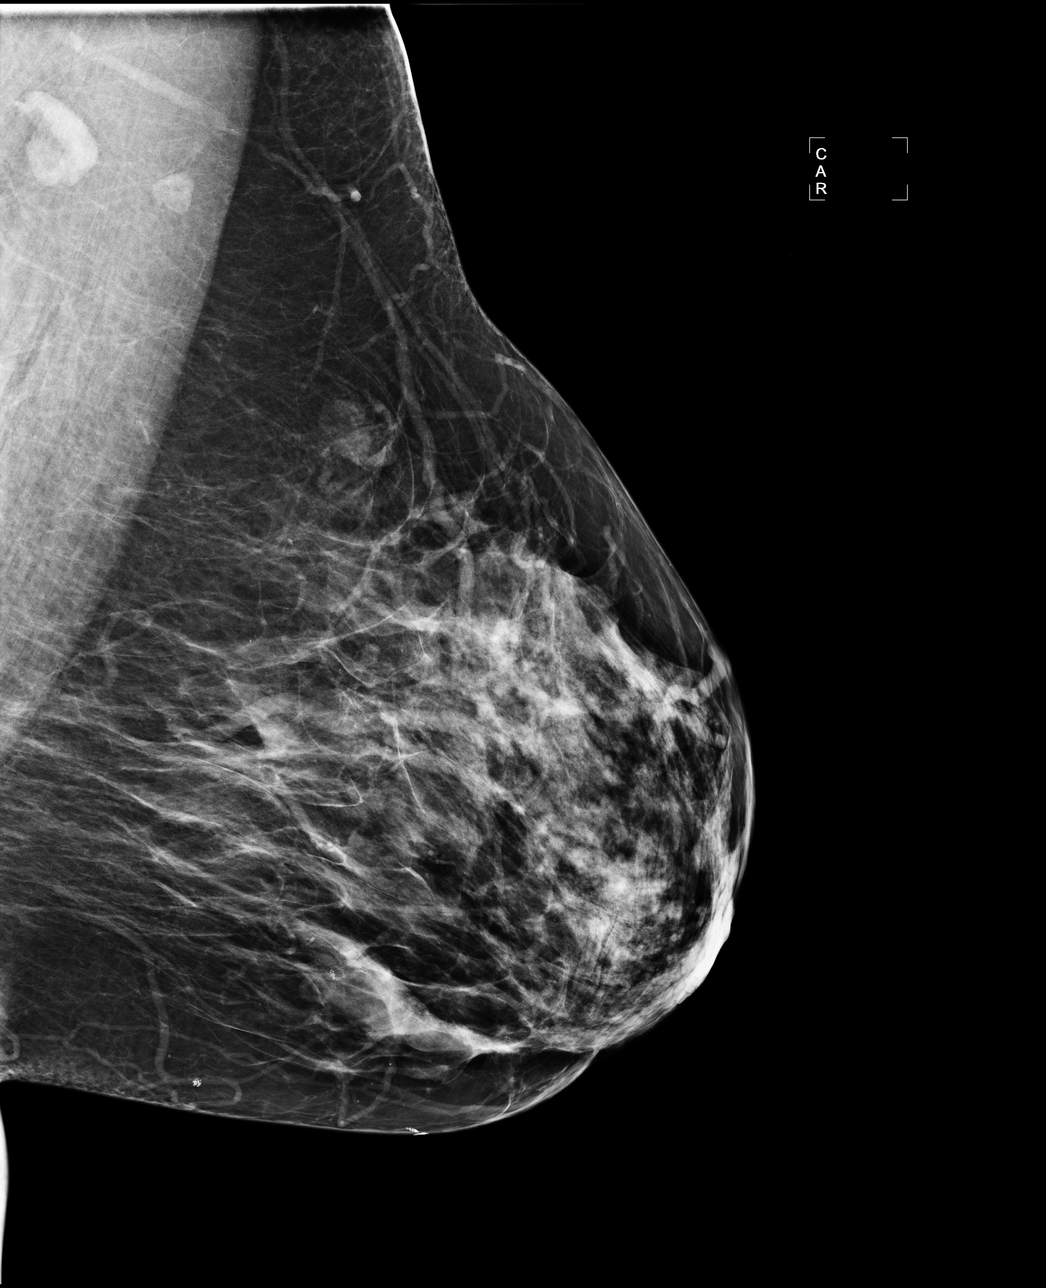

[R MLO]
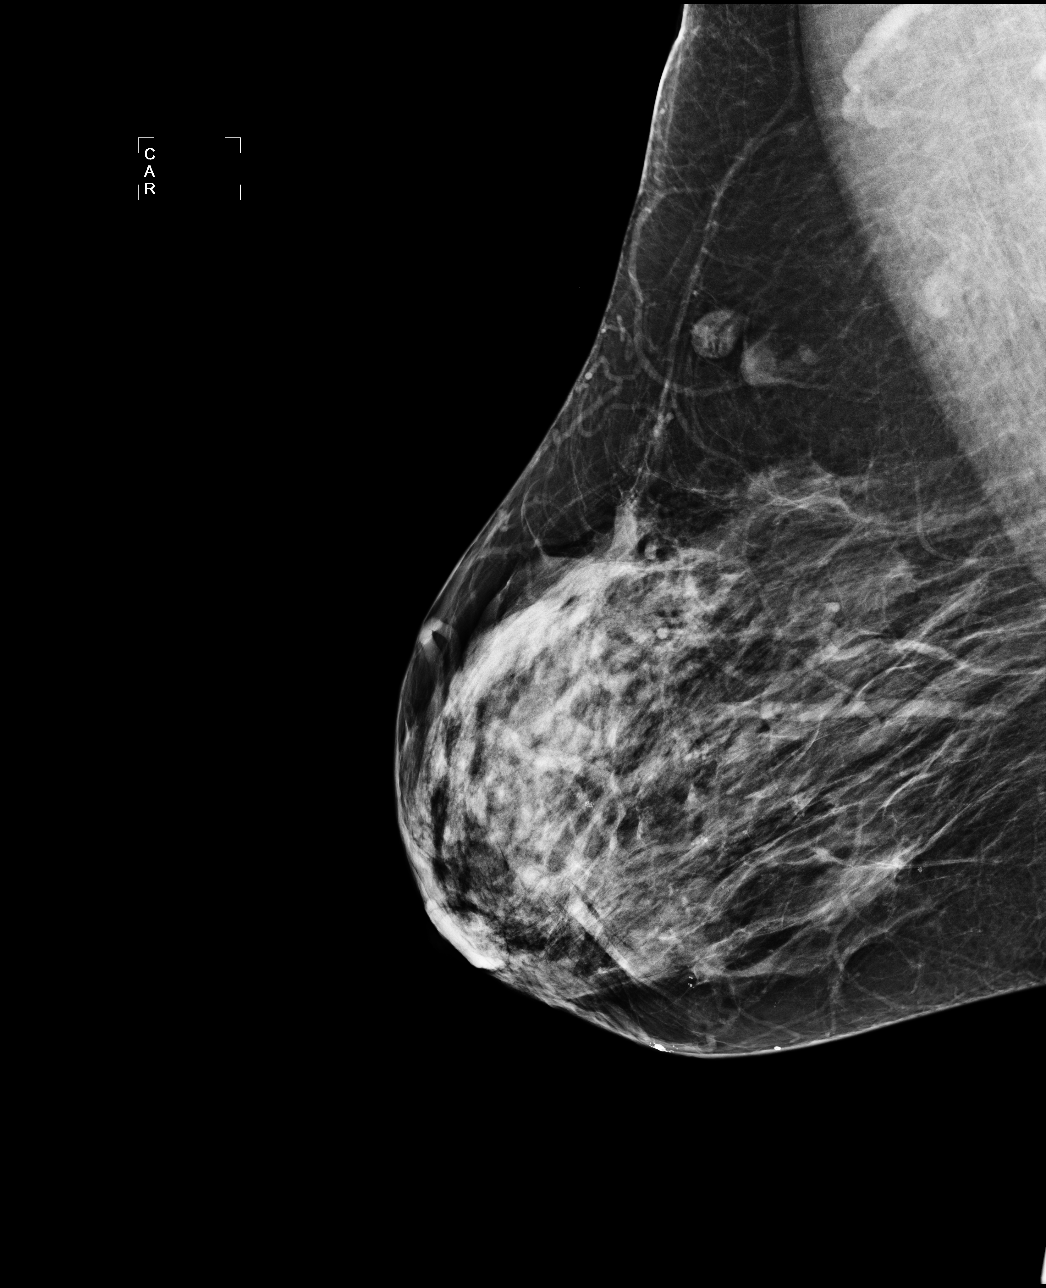

[4 of 4 positions shown; findings below may reference images not displayed]

FINDINGS: There are scattered fibroglandular densities.  No new
suspicious mass or malignant-type microcalcifications identified in
either breast.  The low density mass in the medial aspect of the
left breast is unchanged when compared to the prior mammogram dated
05/01/2008.
Mammographic images were processed with CAD.

On physical exam, I do not palpate a discrete mass in the left
breast.

Ultrasound is performed, showing there is a stable, hypoechoic,
well-circumscribed mass in the left breast at 10-11 o'clock 10 cm
from the nipple measuring 1.4 x 0.4 x 1.4 cm.  On the prior
ultrasound dated 12/06/2008 it measures 1.4 x 0.4 x 1.6 cm.
IMPRESSION: Stable probable benign fibroadenoma in the left breast.  Short-term
interval follow-up left breast ultrasound in 6 months is
recommended.   If the lesion is stable at that time I would
recommend an additional bilateral diagnostic mammogram and left
breast ultrasound in another 6 months to complete the 2-year follow-
up.

BI-RADS CATEGORY 3:  Probably benign finding(s) - short interval
follow-up suggested.

## 2011-09-28 ENCOUNTER — Ambulatory Visit: Payer: Self-pay | Admitting: Neurology

## 2011-11-05 ENCOUNTER — Ambulatory Visit: Payer: Self-pay | Admitting: Neurology

## 2012-09-06 ENCOUNTER — Emergency Department (HOSPITAL_COMMUNITY)
Admission: EM | Admit: 2012-09-06 | Discharge: 2012-09-06 | Disposition: A | Discharge status: Home / Self Care | Payer: Medicaid Other | Attending: Emergency Medicine | Admitting: Emergency Medicine

## 2012-09-06 ENCOUNTER — Encounter (HOSPITAL_COMMUNITY): Payer: Self-pay | Admitting: Emergency Medicine

## 2012-09-06 DIAGNOSIS — B3731 Acute candidiasis of vulva and vagina: Secondary | ICD-10-CM | POA: Insufficient documentation

## 2012-09-06 DIAGNOSIS — E1169 Type 2 diabetes mellitus with other specified complication: Secondary | ICD-10-CM | POA: Insufficient documentation

## 2012-09-06 DIAGNOSIS — Z3202 Encounter for pregnancy test, result negative: Secondary | ICD-10-CM | POA: Insufficient documentation

## 2012-09-06 DIAGNOSIS — Z794 Long term (current) use of insulin: Secondary | ICD-10-CM | POA: Insufficient documentation

## 2012-09-06 DIAGNOSIS — Z79899 Other long term (current) drug therapy: Secondary | ICD-10-CM | POA: Insufficient documentation

## 2012-09-06 DIAGNOSIS — Z7982 Long term (current) use of aspirin: Secondary | ICD-10-CM | POA: Insufficient documentation

## 2012-09-06 DIAGNOSIS — I10 Essential (primary) hypertension: Secondary | ICD-10-CM | POA: Insufficient documentation

## 2012-09-06 DIAGNOSIS — B373 Candidiasis of vulva and vagina: Secondary | ICD-10-CM

## 2012-09-06 LAB — URINE MICROSCOPIC-ADD ON

## 2012-09-06 LAB — URINALYSIS, ROUTINE W REFLEX MICROSCOPIC
Ketones, ur: NEGATIVE mg/dL
Protein, ur: 30 mg/dL — AB
Urobilinogen, UA: 1 mg/dL (ref 0.0–1.0)

## 2012-09-06 LAB — POCT PREGNANCY, URINE: Preg Test, Ur: NEGATIVE

## 2012-09-06 MED ORDER — FLUCONAZOLE 200 MG PO TABS: 200.0000 mg | ORAL_TABLET | Freq: Once | ORAL | Status: AC

## 2012-09-06 MED ORDER — FLUCONAZOLE 150 MG PO TABS
150.0000 mg | ORAL_TABLET | Freq: Once | ORAL | Status: AC
  Administered 2012-09-06: 150 mg via ORAL
  Filled 2012-09-06: qty 1

## 2012-09-06 MED ORDER — MICONAZOLE NITRATE 2 % EX CREA: TOPICAL_CREAM | Freq: Two times a day (BID) | CUTANEOUS | Status: DC

## 2012-09-06 NOTE — ED Notes (Signed)
Pt's spouse states "she has rash between her legs due to her sugar being to high."  Pt states rash itches and burns.  Pt denies any discharge or problems buring or hurting when she urinates. States pt is diabetic and doesn't  Check her blood sugar everyday.

## 2012-09-06 NOTE — ED Provider Notes (Signed)
History     CSN: [id number]  Arrival date & time 09/06/12  8469   First MD Initiated Contact with Patient 09/06/12 [phone]      Chief Complaint  Patient presents with  . Rash  . Hyperglycemia    (Consider location/radiation/quality/duration/timing/severity/associated sxs/prior treatment) Patient is a 47 y.o. female presenting with hyperglycemia and vaginal itching. The history is provided by the patient.  Hyperglycemia Blood sugar level PTA:  >300 Severity:  Moderate Onset quality:  Gradual Duration:  3 days Timing:  Constant Progression:  Unchanged Chronicity:  New Diabetes status:  Controlled with insulin and controlled with oral medications Current diabetic therapy:  NovoLog, Lantus, metformin Context: not change in medication, not new diabetes diagnosis, not noncompliance, not recent change in diet and not recent illness   Relieved by:  Nothing Associated symptoms: no abdominal pain, no confusion, no dehydration, no dysuria, no fatigue, no fever, no increased appetite, no increased thirst, no nausea, no polyuria, no shortness of breath, no vomiting and no weight change   Risk factors: no pregnancy and no recent steroid use   Vaginal Itching This is a new (state having vaginal itching and burning for th elast few days since BS has been elevated) problem. Episode onset: 3 days. The problem occurs constantly. The problem has been gradually worsening. Pertinent negatives include no abdominal pain and no shortness of breath. Associated symptoms comments: No fever, vomiting or dysuria. Nothing aggravates the symptoms. Nothing relieves the symptoms. She has tried nothing for the symptoms. The treatment provided no relief.    Past Medical History  Diagnosis Date  . Diabetes mellitus   . Hypertension     History reviewed. No pertinent past surgical history.  No family history on file.  History  Substance Use Topics  . Smoking status: Never Smoker   . Smokeless tobacco: Never  Used  . Alcohol Use: No    OB History   Grav Para Term Preterm Abortions TAB SAB Ect Mult Living                  Review of Systems  Constitutional: Negative for fever and fatigue.  Respiratory: Negative for cough and shortness of breath.   Gastrointestinal: Negative for nausea, vomiting and abdominal pain.  Endocrine: Negative for polydipsia and polyuria.  Genitourinary: Negative for dysuria.  Psychiatric/Behavioral: Negative for confusion.  All other systems reviewed and are negative.    Allergies  Cholestatin  Home Medications   Current Outpatient Rx  Name  Route  Sig  Dispense  Refill  . acyclovir (ZOVIRAX) 400 MG tablet   Oral   Take 800 mg by mouth every 4 (four) hours while awake.         Marland Kitchen amLODipine (NORVASC) 10 MG tablet   Oral   Take 10 mg by mouth daily.         Marland Kitchen aspirin 81 MG tablet   Oral   Take 81 mg by mouth daily.         . cetirizine (ZYRTEC) 10 MG tablet   Oral   Take 10 mg by mouth daily.         . ergocalciferol (VITAMIN D2) 50000 UNITS capsule   Oral   Take 50,000 Units by mouth once a week.         . erythromycin ophthalmic ointment      at bedtime.         . hydrOXYzine (ATARAX/VISTARIL) 25 MG tablet   Oral   Take 25  mg by mouth 3 (three) times daily as needed.         . insulin aspart (NOVOLOG) 100 UNIT/ML injection   Subcutaneous   Inject into the skin 3 (three) times daily before meals.         . metFORMIN (GLUCOPHAGE) 1000 MG tablet   Oral   Take 1,000 mg by mouth 2 (two) times daily with a meal.         . Polyethyl Glycol-Propyl Glycol (SYSTANE) 0.4-0.3 % SOLN   Ophthalmic   Apply 1 drop to eye every 3 (three) hours.   10 mL   0   . pravastatin (PRAVACHOL) 40 MG tablet   Oral   Take 40 mg by mouth daily.           BP 158/90  Pulse 90  Temp(Src) 98.1 F (36.7 C) (Oral)  Resp 18  SpO2 100%  Physical Exam  Nursing note and vitals reviewed. Constitutional: She is oriented to person,  place, and time. She appears well-developed and well-nourished. No distress.  HENT:  Head: Normocephalic and atraumatic.  Mouth/Throat: Oropharynx is clear and moist.  Eyes: Conjunctivae and EOM are normal. Pupils are equal, round, and reactive to light.  Neck: Normal range of motion. Neck supple.  Cardiovascular: Normal rate, regular rhythm and intact distal pulses.   No murmur heard. Pulmonary/Chest: Effort normal and breath sounds normal. No respiratory distress. She has no wheezes. She has no rales.  Abdominal: Soft. She exhibits no distension. There is no tenderness. There is no rebound and no guarding.  Genitourinary:    There is erythema around the vagina. Vaginal discharge found.  No vesicular lesions  Musculoskeletal: Normal range of motion. She exhibits no edema and no tenderness.  Neurological: She is alert and oriented to person, place, and time.  Skin: Skin is warm and dry. No rash noted. No erythema.  Psychiatric: She has a normal mood and affect. Her behavior is normal.    ED Course  Procedures (including critical care time)  Labs Reviewed  GLUCOSE, CAPILLARY - Abnormal; Notable for the following:    Glucose-Capillary 336 (*)    All other components within normal limits  URINALYSIS, ROUTINE W REFLEX MICROSCOPIC   No results found.   No diagnosis found.    MDM   Patient with a history of diabetes currently on Lantus, NovoLog, metformin who is c/o of vaginal rash for last 3 days consistent with candida on exam.  No signs of herpes and pt denies pain but itching and burning.  States LMP was last week and denies dysuria.  Well appearing without abd pain.  Blood sugar today is >336 and pt states she does not know why.  No change in diet or meds.  Well appearing and normal VS.  Pt given diflucan for candida and instructed to f/u with PCP if sugars remain high.  8:36 AM UPT neg.      Gwyneth Sprout, MD 09/06/12 [phone]

## 2012-09-06 NOTE — Progress Notes (Signed)
[facility] did see patient and provided her with Primary Care Resources. Also, gave her information on the [facility] at [facility].

## 2012-09-07 LAB — URINE CULTURE: Colony Count: 45000

## 2013-01-04 ENCOUNTER — Encounter: Payer: Self-pay | Admitting: Internal Medicine

## 2013-01-04 ENCOUNTER — Ambulatory Visit: Payer: Medicaid Other | Attending: Family Medicine | Admitting: Internal Medicine

## 2013-01-04 VITALS — BP 168/79 | HR 111 | Temp 98.0°F | Resp 16 | Ht 66.0 in | Wt 160.0 lb

## 2013-01-04 DIAGNOSIS — E119 Type 2 diabetes mellitus without complications: Secondary | ICD-10-CM | POA: Insufficient documentation

## 2013-01-04 DIAGNOSIS — I10 Essential (primary) hypertension: Secondary | ICD-10-CM | POA: Insufficient documentation

## 2013-01-04 DIAGNOSIS — E785 Hyperlipidemia, unspecified: Secondary | ICD-10-CM | POA: Insufficient documentation

## 2013-01-04 DIAGNOSIS — Z23 Encounter for immunization: Secondary | ICD-10-CM

## 2013-01-04 LAB — LIPID PANEL
HDL: 62 mg/dL (ref >39)
Triglycerides: 155 mg/dL — ABNORMAL HIGH (ref <150)

## 2013-01-04 LAB — GLUCOSE, POCT (MANUAL RESULT ENTRY): POC Glucose: 185 mg/dl — AB (ref 70–99)

## 2013-01-04 LAB — COMPREHENSIVE METABOLIC PANEL
Albumin: 4.1 g/dL (ref 3.5–5.2)
BUN: 12 mg/dL (ref 6–23)
CO2: 28 mEq/L (ref 19–32)
Calcium: 9.4 mg/dL (ref 8.4–10.5)
Chloride: 97 mEq/L (ref 96–112)
Glucose, Bld: 94 mg/dL (ref 70–99)
Potassium: 3.9 mEq/L (ref 3.5–5.3)

## 2013-01-04 LAB — CBC WITH DIFFERENTIAL/PLATELET
Lymphocytes Relative: 32 % (ref 12–46)
MCH: 24.3 pg — ABNORMAL LOW (ref 26.0–34.0)
MCHC: 33.5 g/dL (ref 30.0–36.0)
MCV: 72.5 fL — ABNORMAL LOW (ref 78.0–100.0)
Monocytes Absolute: 0.5 10*3/uL (ref 0.1–1.0)
Monocytes Relative: 7 % (ref 3–12)
Neutro Abs: 4.3 10*3/uL (ref 1.7–7.7)
RBC: 4.73 MIL/uL (ref 3.87–5.11)
RDW: 15.5 % (ref 11.5–15.5)

## 2013-01-04 MED ORDER — INSULIN ASPART 100 UNIT/ML ~~LOC~~ SOLN: 10.0000 [IU] | Freq: Three times a day (TID) | SUBCUTANEOUS | Status: AC

## 2013-01-04 MED ORDER — INSULIN GLARGINE 100 UNIT/ML SOLOSTAR PEN: 70.0000 [IU] | PEN_INJECTOR | Freq: Every day | SUBCUTANEOUS | Status: DC

## 2013-01-04 MED ORDER — METFORMIN HCL 1000 MG PO TABS: 1000.0000 mg | ORAL_TABLET | Freq: Two times a day (BID) | ORAL | Status: AC

## 2013-01-04 MED ORDER — PRAVASTATIN SODIUM 40 MG PO TABS: 40.0000 mg | ORAL_TABLET | Freq: Every day | ORAL | Status: AC

## 2013-01-04 MED ORDER — CETIRIZINE HCL 10 MG PO TABS: 10.0000 mg | ORAL_TABLET | Freq: Every day | ORAL | Status: AC | PRN

## 2013-01-04 MED ORDER — NORETHIN-ETH ESTRAD TRIPHASIC 0.5/0.75/1-35 MG-MCG PO TABS: 1.0000 | ORAL_TABLET | Freq: Every day | ORAL | Status: DC

## 2013-01-04 MED ORDER — AMLODIPINE BESYLATE 10 MG PO TABS: 10.0000 mg | ORAL_TABLET | Freq: Every day | ORAL | Status: AC

## 2013-01-04 MED ORDER — ASPIRIN 81 MG PO TABS: 81.0000 mg | ORAL_TABLET | Freq: Every day | ORAL | Status: AC

## 2013-01-04 MED ORDER — HYDROCHLOROTHIAZIDE 25 MG PO TABS: 25.0000 mg | ORAL_TABLET | Freq: Every day | ORAL | Status: AC

## 2013-01-04 NOTE — Progress Notes (Signed)
PT HERE TO ESTABLISH CARE  HX HTN,DIABETES INSULIN DEPENDENT,CHOLESTEROL NEED A1C,CBG

## 2013-01-04 NOTE — Progress Notes (Signed)
Patient ID: CHLOE MIYOSHI, female   DOB: 03-22-1966, 47 y.o.   MRN: [id number] Patient Demographics  Karla Mack, is a 47 y.o. female  JXB:[id number]  ZHY:[id number]  DOB - 04/23/1965  Chief Complaint  Patient presents with  . Establish Care  . Hypertension  . Diabetes  . Hyperlipidemia        Subjective:   Karla Mack today is here to establish primary care.  Patient is a 47 year old female with history of hypertension, hyperlipidemia, diabetes mellitus, insulin-dependent, has not is [facility].  The patient states that she needs all her medications. BP 168/79. [place] CBG 132, hemoglobin A1c 10.5  Patient has No headache, No chest pain, No abdominal pain - No Nausea, No new weakness tingling or numbness, No Cough - SOB.  Objective:    Filed Vitals:   01/04/13 1119  BP: 168/79  Pulse: 111  Temp: 98 F (36.7 C)  TempSrc: Oral  Resp: 16  Height: 5\' 6"  (1.676 m)  Weight: 160 lb (72.576 kg)  SpO2: 100%     ALLERGIES:   Allergies  Allergen Reactions  . Cholestatin     Itchy eyes, runny nose, sneezing    PAST MEDICAL HISTORY: Past Medical History  Diagnosis Date  . Diabetes mellitus   . Hypertension     PAST SURGICAL HISTORY: History reviewed. No pertinent past surgical history.  FAMILY HISTORY: History reviewed. No pertinent family history.  MEDICATIONS AT HOME: Prior to Admission medications   Medication Sig Start Date End Date Taking? Authorizing Provider  amLODipine (NORVASC) 10 MG tablet Take 1 tablet (10 mg total) by mouth daily. 01/04/13  Yes Ripudeep Jenna Luo, MD  hydrochlorothiazide (HYDRODIURIL) 25 MG tablet Take 1 tablet (25 mg total) by mouth daily. 01/04/13  Yes Ripudeep Jenna Luo, MD  insulin aspart (NOVOLOG) 100 UNIT/ML injection Inject 10-15 Units into the skin 3 (three) times daily before meals. 10 units in the morning 15 units at lunch 10 units at dinner 01/04/13  Yes Ripudeep K Rai, MD  Insulin Glargine (LANTUS  SOLOSTAR) 100 UNIT/ML SOPN Inject 70 Units into the skin at bedtime. 01/04/13  Yes Ripudeep Jenna Luo, MD  metFORMIN (GLUCOPHAGE) 1000 MG tablet Take 1,000 mg by mouth 2 (two) times daily with a meal.   Yes Historical Provider, MD  norethindrone-ethinyl estradiol (TRIPHASIL,CYCLAFEM,ALYACEN) 0.5/0.75/1-35 MG-MCG tablet Take 1 tablet by mouth daily.   Yes Historical Provider, MD  pravastatin (PRAVACHOL) 40 MG tablet Take 1 tablet (40 mg total) by mouth daily. 01/04/13  Yes Ripudeep Jenna Luo, MD  aspirin 81 MG tablet Take 1 tablet (81 mg total) by mouth daily. 01/04/13   Ripudeep Jenna Luo, MD  cetirizine (ZYRTEC) 10 MG tablet Take 1 tablet (10 mg total) by mouth daily as needed for allergies. 01/04/13   Ripudeep Jenna Luo, MD  miconazole (MICOTIN) 2 % cream Apply topically 2 (two) times daily. 09/06/12   Gwyneth Sprout, MD    REVIEW OF SYSTEMS:  Constitutional:   No   Fevers, chills, fatigue.  HEENT:    No headaches, Sore throat,   Cardio-vascular: No chest pain,  Orthopnea, swelling in lower extremities, anasarca, palpitations  GI:  No abdominal pain, nausea, vomiting, diarrhea  Resp: No shortness of breath,  No coughing up of blood.No cough.No wheezing.  Skin:  no rash or lesions.  GU:  no dysuria, change in color of urine, no urgency or frequency.  No flank pain.  Musculoskeletal: No joint pain or swelling.  No  decreased range of motion.  No back pain.  Psych: No change in mood or affect. No depression or anxiety.  No memory loss.   Exam  General appearance :Awake, alert, NAD, Speech Clear. HEENT: Atraumatic and Normocephalic, PERLA Neck: supple, no JVD. No cervical lymphadenopathy.  Chest: clear to auscultation bilaterally, no wheezing, rales or rhonchi CVS: S1 S2 regular, no murmurs.  Abdomen: soft, NBS, NT, ND, no gaurding, rigidity or rebound. Extremities: No cyanosis, clubbing, B/L Lower Ext shows no edema,  Neurology: Awake alert, and oriented X 3, CN II-XII intact, Non  focal Skin:No Rash or lesions Wounds: N/A    Data Review   Basic Metabolic Panel: No results found for this basename: NA, K, CL, CO2, GLUCOSE, BUN, CREATININE, CALCIUM, MG, PHOS,  in the last 168 hours Liver Function Tests: No results found for this basename: AST, ALT, ALKPHOS, BILITOT, PROT, ALBUMIN,  in the last 168 hours  CBC: No results found for this basename: WBC, NEUTROABS, HGB, HCT, MCV, PLT,  in the last 168 hours ------------------------------------------------------------------------------------------------------------------ No results found for this basename: HGBA1C,  in the last 72 hours ------------------------------------------------------------------------------------------------------------------ No results found for this basename: CHOL, HDL, LDLCALC, TRIG, CHOLHDL, LDLDIRECT,  in the last 72 hours ------------------------------------------------------------------------------------------------------------------ No results found for this basename: TSH, T4TOTAL, FREET3, T3FREE, THYROIDAB,  in the last 72 hours ------------------------------------------------------------------------------------------------------------------ No results found for this basename: VITAMINB12, FOLATE, FERRITIN, TIBC, IRON, RETICCTPCT,  in the last 72 hours  Coagulation profile  No results found for this basename: INR, PROTIME,  in the last 168 hours    Assessment & Plan   Active Problems: 1. Hypertension - Will restart amlodipine, HCTZ  2 hyperlipidemia continue statin, will check LFTs  3. diabetes mellitus: Insulin-dependent - Will refill Lantus, currently on 70 units each bedtime, NovoLog pens 10 units, AM, 15units lunch, 10 units qhs, metformin - Spot CBC 132, hemoglobin A1c 10.5  4. health screening - Flu shot today - Ambulatory referral to OBGYN for Pap smear - Screening mammogram - CBC, CMET, lipid panel  Follow-up in 2 months   RAI,RIPUDEEP M.D. 01/04/2013, 11:49 AM

## 2013-01-30 ENCOUNTER — Ambulatory Visit
Admission: RE | Admit: 2013-01-30 | Discharge: 2013-01-30 | Disposition: A | Discharge status: Home / Self Care | Payer: Medicaid Other | Source: Ambulatory Visit | Attending: Internal Medicine | Admitting: Internal Medicine

## 2013-01-30 DIAGNOSIS — I10 Essential (primary) hypertension: Secondary | ICD-10-CM

## 2013-01-30 DIAGNOSIS — E119 Type 2 diabetes mellitus without complications: Secondary | ICD-10-CM

## 2013-01-30 DIAGNOSIS — E785 Hyperlipidemia, unspecified: Secondary | ICD-10-CM

## 2013-01-31 ENCOUNTER — Other Ambulatory Visit: Payer: Self-pay | Admitting: Internal Medicine

## 2013-01-31 MED ORDER — INSULIN GLARGINE 100 UNIT/ML SOLOSTAR PEN: 70.0000 [IU] | PEN_INJECTOR | Freq: Every day | SUBCUTANEOUS | Status: AC

## 2013-03-06 ENCOUNTER — Ambulatory Visit: Payer: No Typology Code available for payment source

## 2013-03-07 ENCOUNTER — Ambulatory Visit (INDEPENDENT_AMBULATORY_CARE_PROVIDER_SITE_OTHER): Payer: Medicaid Other | Admitting: Obstetrics & Gynecology

## 2013-03-07 ENCOUNTER — Encounter: Payer: Self-pay | Admitting: Obstetrics & Gynecology

## 2013-03-07 VITALS — BP 118/84 | HR 110 | Temp 97.8°F | Ht 65.0 in | Wt 158.0 lb

## 2013-03-07 DIAGNOSIS — Z0000 Encounter for general adult medical examination without abnormal findings: Secondary | ICD-10-CM

## 2013-03-07 DIAGNOSIS — Z975 Presence of (intrauterine) contraceptive device: Secondary | ICD-10-CM

## 2013-03-07 DIAGNOSIS — Z30430 Encounter for insertion of intrauterine contraceptive device: Secondary | ICD-10-CM

## 2013-03-07 DIAGNOSIS — Z01812 Encounter for preprocedural laboratory examination: Secondary | ICD-10-CM

## 2013-03-07 LAB — POCT PREGNANCY, URINE: Preg Test, Ur: NEGATIVE

## 2013-03-07 NOTE — Progress Notes (Signed)
  Subjective:    Patient ID: Karla Mack, female    DOB: Jan 26, 1966, 47 y.o.   MRN: [id number]  HPI  She is a 47 yo M lady who is here today because she would like to have Mirena for contraception. She has been taking OCPs until she ran out 2 months ago. She reports abstinence since she ran out of OCPs. She does not want more children.  Review of Systems She is due for a pap and mammogram    Objective:   Physical Exam  UPT negative, consent signed, Time out procedure done. Cervix prepped with betadine and grasped with a single tooth tenaculum. She is currently having her period. Mirena was easily placed and the strings were cut to 3-4 cm. Uterus sounded to 9 cm. She tolerated the procedure well.       Assessment & Plan:  Contraception- Mirena Preventative- schedule annual/mammogram She declines a flu vaccine

## 2013-03-08 ENCOUNTER — Encounter: Payer: Self-pay | Admitting: *Deleted

## 2013-04-04 ENCOUNTER — Ambulatory Visit (INDEPENDENT_AMBULATORY_CARE_PROVIDER_SITE_OTHER): Payer: Medicaid Other | Admitting: Obstetrics & Gynecology

## 2013-04-04 ENCOUNTER — Encounter: Payer: Self-pay | Admitting: Obstetrics & Gynecology

## 2013-04-04 ENCOUNTER — Other Ambulatory Visit (HOSPITAL_COMMUNITY)
Admission: RE | Admit: 2013-04-04 | Discharge: 2013-04-04 | Disposition: A | Discharge status: Home / Self Care | Payer: Medicaid Other | Source: Ambulatory Visit | Attending: Obstetrics & Gynecology | Admitting: Obstetrics & Gynecology

## 2013-04-04 VITALS — BP 144/77 | HR 90 | Temp 97.8°F | Ht 66.0 in | Wt 158.1 lb

## 2013-04-04 DIAGNOSIS — Z1151 Encounter for screening for human papillomavirus (HPV): Secondary | ICD-10-CM | POA: Insufficient documentation

## 2013-04-04 DIAGNOSIS — Z0000 Encounter for general adult medical examination without abnormal findings: Secondary | ICD-10-CM

## 2013-04-04 DIAGNOSIS — Z01419 Encounter for gynecological examination (general) (routine) without abnormal findings: Secondary | ICD-10-CM | POA: Insufficient documentation

## 2013-04-04 NOTE — Progress Notes (Signed)
Subjective:    Karla Mack is a 47 y.o. female who presents for an annual exam. The patient has no complaints today. She is also here for a string check. She has no IUD complaints. The patient is sexually active. GYN screening history: last pap: was normal. The patient wears seatbelts: yes. The patient participates in regular exercise: no. I have encouraged her to do so. Has the patient ever been transfused or tattooed?: no. The patient reports that there is not domestic violence in her life.   Menstrual History: OB History   Grav Para Term Preterm Abortions TAB SAB Ect Mult Living                  Menarche age: 89 Patient's last menstrual period was 03/06/2013.    The following portions of the patient's history were reviewed and updated as appropriate: allergies, current medications, past family history, past medical history, past social history, past surgical history and problem list.  Review of Systems A comprehensive review of systems was negative.    Objective:    BP 144/77  Pulse 90  Temp(Src) 97.8 F (36.6 C) (Oral)  Ht 5\' 6"  (1.676 m)  Wt 158 lb 1.6 oz (71.714 kg)  BMI 25.53 kg/m2  LMP 03/06/2013  General Appearance:    Alert, cooperative, no distress, appears stated age  Head:    Normocephalic, without obvious abnormality, atraumatic  Eyes:    PERRL, conjunctiva/corneas clear, EOM's intact, fundi    benign, both eyes  Ears:    Normal TM's and external ear canals, both ears  Nose:   Nares normal, septum midline, mucosa normal, no drainage    or sinus tenderness  Throat:   Lips, mucosa, and tongue normal; teeth and gums normal  Neck:   Supple, symmetrical, trachea midline, no adenopathy;    thyroid:  no enlargement/tenderness/nodules; no carotid   bruit or JVD  Back:     Symmetric, no curvature, ROM normal, no CVA tenderness  Lungs:     Clear to auscultation bilaterally, respirations unlabored  Chest Wall:    No tenderness or deformity   Heart:    Regular rate  and rhythm, S1 and S2 normal, no murmur, rub   or gallop  Breast Exam:    No tenderness, masses, or nipple abnormality  Abdomen:     Soft, non-tender, bowel sounds active all four quadrants,    no masses, no organomegaly  Genitalia:    Normal female without lesion, discharge or tenderness, ULN size, AV, NT, mobile, normal adnexal exam Mirena string seen     Extremities:   Extremities normal, atraumatic, no cyanosis or edema  Pulses:   2+ and symmetric all extremities  Skin:   Skin color, texture, turgor normal, no rashes or lesions  Lymph nodes:   Cervical, supraclavicular, and axillary nodes normal  Neurologic:   CNII-XII intact, normal strength, sensation and reflexes    throughout  .    Assessment:    Healthy female exam.    Plan:     Breast self exam technique reviewed and patient encouraged to perform self-exam monthly. Thin prep Pap smear. with cotesting

## 2013-08-27 ENCOUNTER — Other Ambulatory Visit (HOSPITAL_COMMUNITY): Payer: Self-pay | Admitting: Internal Medicine

## 2013-08-27 ENCOUNTER — Ambulatory Visit (HOSPITAL_COMMUNITY)
Admission: RE | Admit: 2013-08-27 | Discharge: 2013-08-27 | Disposition: A | Discharge status: Home / Self Care | Payer: Medicaid Other | Source: Ambulatory Visit | Attending: Internal Medicine | Admitting: Internal Medicine

## 2013-08-27 DIAGNOSIS — R52 Pain, unspecified: Secondary | ICD-10-CM

## 2013-08-27 DIAGNOSIS — M25569 Pain in unspecified knee: Secondary | ICD-10-CM | POA: Insufficient documentation

## 2013-08-27 DIAGNOSIS — M25469 Effusion, unspecified knee: Secondary | ICD-10-CM | POA: Insufficient documentation

## 2014-05-13 ENCOUNTER — Encounter: Payer: Self-pay | Admitting: Obstetrics & Gynecology

## 2014-05-13 ENCOUNTER — Ambulatory Visit (INDEPENDENT_AMBULATORY_CARE_PROVIDER_SITE_OTHER): Payer: Medicaid Other | Admitting: Obstetrics & Gynecology

## 2014-05-13 VITALS — BP 136/75 | HR 93 | Temp 97.9°F | Ht 66.0 in | Wt 162.1 lb

## 2014-05-13 DIAGNOSIS — Z30431 Encounter for routine checking of intrauterine contraceptive device: Secondary | ICD-10-CM

## 2014-05-13 NOTE — Patient Instructions (Signed)
Levonorgestrel intrauterine device (IUD) What is this medicine? LEVONORGESTREL IUD (LEE voe nor jes trel) is a contraceptive (birth control) device. The device is placed inside the uterus by a healthcare professional. It is used to prevent pregnancy and can also be used to treat heavy bleeding that occurs during your period. Depending on the device, it can be used for 3 to 5 years. This medicine may be used for other purposes; ask your health care provider or pharmacist if you have questions. COMMON BRAND NAME(S): LILETTA, Mirena, Skyla What should I tell my health care provider before I take this medicine? They need to know if you have any of these conditions: -abnormal Pap smear -cancer of the breast, uterus, or cervix -diabetes -endometritis -genital or pelvic infection now or in the past -have more than one sexual partner or your partner has more than one partner -heart disease -history of an ectopic or tubal pregnancy -immune system problems -IUD in place -liver disease or tumor -problems with blood clots or take blood-thinners -use intravenous drugs -uterus of unusual shape -vaginal bleeding that has not been explained -an unusual or allergic reaction to levonorgestrel, other hormones, silicone, or polyethylene, medicines, foods, dyes, or preservatives -pregnant or trying to get pregnant -breast-feeding How should I use this medicine? This device is placed inside the uterus by a health care professional. Talk to your pediatrician regarding the use of this medicine in children. Special care may be needed. Overdosage: If you think you have taken too much of this medicine contact a poison control center or emergency room at once. NOTE: This medicine is only for you. Do not share this medicine with others. What if I miss a dose? This does not apply. What may interact with this medicine? Do not take this medicine with any of the following  medications: -amprenavir -bosentan -fosamprenavir This medicine may also interact with the following medications: -aprepitant -barbiturate medicines for inducing sleep or treating seizures -bexarotene -griseofulvin -medicines to treat seizures like carbamazepine, ethotoin, felbamate, oxcarbazepine, phenytoin, topiramate -modafinil -pioglitazone -rifabutin -rifampin -rifapentine -some medicines to treat HIV infection like atazanavir, indinavir, lopinavir, nelfinavir, tipranavir, ritonavir -St. John's wort -warfarin This list may not describe all possible interactions. Give your health care provider a list of all the medicines, herbs, non-prescription drugs, or dietary supplements you use. Also tell them if you smoke, drink alcohol, or use illegal drugs. Some items may interact with your medicine. What should I watch for while using this medicine? Visit your doctor or health care professional for regular check ups. See your doctor if you or your partner has sexual contact with others, becomes HIV positive, or gets a sexual transmitted disease. This product does not protect you against HIV infection (AIDS) or other sexually transmitted diseases. You can check the placement of the IUD yourself by reaching up to the top of your vagina with clean fingers to feel the threads. Do not pull on the threads. It is a good habit to check placement after each menstrual period. Call your doctor right away if you feel more of the IUD than just the threads or if you cannot feel the threads at all. The IUD may come out by itself. You may become pregnant if the device comes out. If you notice that the IUD has come out use a backup birth control method like condoms and call your health care provider. Using tampons will not change the position of the IUD and are okay to use during your period. What side effects may   I notice from receiving this medicine? Side effects that you should report to your doctor or  health care professional as soon as possible: -allergic reactions like skin rash, itching or hives, swelling of the face, lips, or tongue -fever, flu-like symptoms -genital sores -high blood pressure -no menstrual period for 6 weeks during use -pain, swelling, warmth in the leg -pelvic pain or tenderness -severe or sudden headache -signs of pregnancy -stomach cramping -sudden shortness of breath -trouble with balance, talking, or walking -unusual vaginal bleeding, discharge -yellowing of the eyes or skin Side effects that usually do not require medical attention (report to your doctor or health care professional if they continue or are bothersome): -acne -breast pain -change in sex drive or performance -changes in weight -cramping, dizziness, or faintness while the device is being inserted -headache -irregular menstrual bleeding within first 3 to 6 months of use -nausea This list may not describe all possible side effects. Call your doctor for medical advice about side effects. You may report side effects to FDA at 1-800-FDA-1088. Where should I keep my medicine? This does not apply. NOTE: This sheet is a summary. It may not cover all possible information. If you have questions about this medicine, talk to your doctor, pharmacist, or health care provider.  2015, [employer] Standard. (2011-05-06 13:54:04)  

## 2014-05-13 NOTE — Progress Notes (Signed)
Patient ID: Karla Mack, female   DOB: 06/24/1965, 49 y.o.   MRN: [id number] History:  49 y.o. [id number] here today for today for IUD string check; Mirena IUD was placed 03/2013. No complaints about the Mirena, no concerning side effects. PAP 04/04/2013: neg HR HPV   The following portions of the patient's history were reviewed and updated as appropriate: allergies, current medications, past family history, past medical history, past social history, past surgical history and problem list.   Review of Systems:  Pertinent items are noted in HPI.  Objective:  Physical Exam Blood pressure 136/75, pulse 93, temperature 97.9 F (36.6 C), height 5\' 6"  (1.676 m), weight 162 lb 1.6 oz (73.528 kg). Gen: NAD Abd: Soft, nontender and nondistended Pelvic: Normal appearing external genitalia; normal appearing vaginal mucosa and cervix.  IUD strings visualized, about 3 cm in length outside cervix.   Assessment & Plan:  Normal IUD check. Patient to keep IUD in place for five years; can come in for removal if she desires pregnancy within the next five years. Routine preventative health maintenance measures emphasized.

## 2014-05-14 ENCOUNTER — Other Ambulatory Visit: Payer: Self-pay

## 2014-05-14 DIAGNOSIS — Z1231 Encounter for screening mammogram for malignant neoplasm of breast: Secondary | ICD-10-CM

## 2014-05-21 ENCOUNTER — Ambulatory Visit
Admission: RE | Admit: 2014-05-21 | Discharge: 2014-05-21 | Disposition: A | Discharge status: Home / Self Care | Payer: Medicaid Other | Source: Ambulatory Visit

## 2014-05-21 DIAGNOSIS — Z1231 Encounter for screening mammogram for malignant neoplasm of breast: Secondary | ICD-10-CM

## 2015-05-16 ENCOUNTER — Other Ambulatory Visit: Payer: Self-pay

## 2015-05-16 DIAGNOSIS — Z1231 Encounter for screening mammogram for malignant neoplasm of breast: Secondary | ICD-10-CM

## 2015-05-27 ENCOUNTER — Ambulatory Visit
Admission: RE | Admit: 2015-05-27 | Discharge: 2015-05-27 | Disposition: A | Discharge status: Home / Self Care | Payer: Medicaid Other | Source: Ambulatory Visit

## 2015-05-27 ENCOUNTER — Ambulatory Visit: Payer: Medicaid Other

## 2015-05-27 DIAGNOSIS — Z1231 Encounter for screening mammogram for malignant neoplasm of breast: Secondary | ICD-10-CM

## 2015-10-23 ENCOUNTER — Emergency Department (HOSPITAL_COMMUNITY): Payer: Medicaid Other

## 2015-10-23 ENCOUNTER — Encounter (HOSPITAL_COMMUNITY): Payer: Self-pay

## 2015-10-23 ENCOUNTER — Emergency Department (HOSPITAL_COMMUNITY)
Admission: EM | Admit: 2015-10-23 | Discharge: 2015-10-23 | Disposition: A | Discharge status: Home / Self Care | Payer: Medicaid Other | Attending: Emergency Medicine | Admitting: Emergency Medicine

## 2015-10-23 DIAGNOSIS — E119 Type 2 diabetes mellitus without complications: Secondary | ICD-10-CM | POA: Insufficient documentation

## 2015-10-23 DIAGNOSIS — Y9301 Activity, walking, marching and hiking: Secondary | ICD-10-CM | POA: Insufficient documentation

## 2015-10-23 DIAGNOSIS — I10 Essential (primary) hypertension: Secondary | ICD-10-CM | POA: Insufficient documentation

## 2015-10-23 DIAGNOSIS — Z79899 Other long term (current) drug therapy: Secondary | ICD-10-CM | POA: Diagnosis not present

## 2015-10-23 DIAGNOSIS — M25551 Pain in right hip: Secondary | ICD-10-CM | POA: Diagnosis present

## 2015-10-23 DIAGNOSIS — Z7982 Long term (current) use of aspirin: Secondary | ICD-10-CM | POA: Insufficient documentation

## 2015-10-23 DIAGNOSIS — Z794 Long term (current) use of insulin: Secondary | ICD-10-CM | POA: Diagnosis not present

## 2015-10-23 DIAGNOSIS — Z7984 Long term (current) use of oral hypoglycemic drugs: Secondary | ICD-10-CM | POA: Diagnosis not present

## 2015-10-23 DIAGNOSIS — S32691A Other specified fracture of right ischium, initial encounter for closed fracture: Secondary | ICD-10-CM | POA: Diagnosis not present

## 2015-10-23 DIAGNOSIS — Y92002 Bathroom of unspecified non-institutional (private) residence single-family (private) house as the place of occurrence of the external cause: Secondary | ICD-10-CM | POA: Diagnosis not present

## 2015-10-23 DIAGNOSIS — Y999 Unspecified external cause status: Secondary | ICD-10-CM | POA: Diagnosis not present

## 2015-10-23 DIAGNOSIS — W010XXA Fall on same level from slipping, tripping and stumbling without subsequent striking against object, initial encounter: Secondary | ICD-10-CM | POA: Insufficient documentation

## 2015-10-23 DIAGNOSIS — S32601A Unspecified fracture of right ischium, initial encounter for closed fracture: Secondary | ICD-10-CM

## 2015-10-23 MED ORDER — IBUPROFEN 800 MG PO TABS
800.0000 mg | ORAL_TABLET | Freq: Once | ORAL | Status: AC
  Administered 2015-10-23: 800 mg via ORAL
  Filled 2015-10-23: qty 1

## 2015-10-23 MED ORDER — OXYCODONE-ACETAMINOPHEN 5-325 MG PO TABS: 2.0000 | ORAL_TABLET | Freq: Four times a day (QID) | ORAL | Status: DC | PRN

## 2015-10-23 NOTE — ED Notes (Signed)
Bed: WA17 Expected date:  Expected time:  Means of arrival:  Comments: EVS  

## 2015-10-23 NOTE — ED Provider Notes (Signed)
CSN: [id number]     Arrival date & time 10/23/15  1038 History   First MD Initiated Contact with Patient 10/23/15 1102     Chief Complaint  Patient presents with  . Fall  . Back Pain    HPI    50 YOF presents status post fall with right anterior hip and groin pain. Pt reports that she woke up this morning and was walking to the bathroom when she had a mechanical fall landing on her right anterior hip. She reprots immediate pain to the lower back and right groin. She was able to ambualte but had significant pain ion the groin. She reports normal bladder function after the injury with no blood noted. She denies any lower extremity strength or sensory deficits. Last PO intake last night.   History of HTN, DM  Past Medical History  Diagnosis Date  . Diabetes mellitus   . Hypertension    History reviewed. No pertinent past surgical history. History reviewed. No pertinent family history. Social History  Substance Use Topics  . Smoking status: Never Smoker   . Smokeless tobacco: Never Used  . Alcohol Use: No   OB History    Gravida Para Term Preterm AB TAB SAB Ectopic Multiple Living   Review of Systems  All other systems reviewed and are negative.   Allergies  Cholestatin  Home Medications   Prior to Admission medications   Medication Sig Start Date End Date Taking? Authorizing Provider  amLODipine (NORVASC) 10 MG tablet Take 1 tablet (10 mg total) by mouth daily. 01/04/13   Ripudeep Jenna Luo, MD  aspirin 81 MG tablet Take 1 tablet (81 mg total) by mouth daily. Patient not taking: Reported on 05/13/2014 01/04/13   Ripudeep Jenna Luo, MD  canagliflozin (INVOKANA) 300 MG TABS tablet Take 300 mg by mouth daily before breakfast.    Historical Provider, MD  cetirizine (ZYRTEC) 10 MG tablet Take 1 tablet (10 mg total) by mouth daily as needed for allergies. 01/04/13   Ripudeep Jenna Luo, MD  hydrochlorothiazide (HYDRODIURIL) 25 MG tablet Take 1 tablet (25 mg total) by mouth  daily. Patient not taking: Reported on 05/13/2014 01/04/13   Ripudeep Jenna Luo, MD  insulin aspart (NOVOLOG) 100 UNIT/ML injection Inject 10-15 Units into the skin 3 (three) times daily before meals. 10 units in the morning 15 units at lunch 10 units at dinner 01/04/13   Ripudeep K Rai, MD  Insulin Glargine (LANTUS SOLOSTAR) 100 UNIT/ML SOPN Inject 70 Units into the skin at bedtime. 01/31/13   Quentin Angst, MD  levonorgestrel (MIRENA) 20 MCG/24HR IUD 1 each by Intrauterine route once.    Historical Provider, MD  lisinopril-hydrochlorothiazide (PRINZIDE,ZESTORETIC) 10-12.5 MG per tablet Take 1 tablet by mouth daily.    Historical Provider, MD  meloxicam (MOBIC) 7.5 MG tablet Take 7.5 mg by mouth daily.    Historical Provider, MD  metFORMIN (GLUCOPHAGE) 1000 MG tablet Take 1 tablet (1,000 mg total) by mouth 2 (two) times daily with a meal. 01/04/13   Ripudeep K Rai, MD  miconazole (MICOTIN) 2 % cream Apply topically 2 (two) times daily. Patient not taking: Reported on 05/13/2014 09/06/12   Gwyneth Sprout, MD  norethindrone-ethinyl estradiol (TRIPHASIL,CYCLAFEM,ALYACEN) 0.5/0.75/1-35 MG-MCG tablet Take 1 tablet by mouth daily. Patient not taking: Reported on 05/13/2014 01/04/13   Ripudeep Jenna Luo, MD  oxyCODONE-acetaminophen (PERCOCET/ROXICET) 5-325 MG tablet Take 2 tablets by mouth every 6 (six)  hours as needed for severe pain. 10/23/15   Eyvonne MechanicJeffrey Sharyl Panchal, PA-C  pravastatin (PRAVACHOL) 40 MG tablet Take 1 tablet (40 mg total) by mouth daily. Patient not taking: Reported on 05/13/2014 01/04/13   Ripudeep Jenna LuoK Rai, MD  simvastatin (ZOCOR) 20 MG tablet Take 20 mg by mouth daily.    Historical Provider, MD   BP 122/75 mmHg  Pulse 85  Temp(Src) 98.7 F (37.1 C) (Oral)  Resp 18  SpO2 100%   Physical Exam  Constitutional: She is oriented to person, place, and time. She appears well-developed and well-nourished.  HENT:  Head: Normocephalic and atraumatic.  Eyes: Conjunctivae are normal. Pupils are equal, round,  and reactive to light. Right eye exhibits no discharge. Left eye exhibits no discharge. No scleral icterus.  Neck: Normal range of motion. No JVD present. No tracheal deviation present.  Pulmonary/Chest: Effort normal. No stridor.  Abdominal:  Abdomen soft nontender  Musculoskeletal:  Tenderness to palpation of the lower lumbar vertebral soft tissue, no obvious deformities. Patient has tenderness to palpation of the right anterior groin and hip, minimal pain with internal/external rotation, flexion. Distal sensation strength and motor function is intact.   Neurological: She is alert and oriented to person, place, and time. Coordination normal.  Psychiatric: She has a normal mood and affect. Her behavior is normal. Judgment and thought content normal.  Nursing note and vitals reviewed.   ED Course  Procedures (including critical care time) Labs Review Labs Reviewed - No data to display  Imaging Review Dg Lumbar Spine Complete  10/23/2015  CLINICAL DATA:  Pain following fall EXAM: LUMBAR SPINE - COMPLETE 4+ VIEW COMPARISON:  None. FINDINGS: Frontal, lateral, spot lumbosacral lateral, and bilateral oblique views were obtained. There are 5 non-rib-bearing lumbar type vertebral bodies. There is mild thoracolumbar dextroscoliosis. There is slight anterior wedging of the T12 vertebral body, age uncertain. No other evidence of fracture. No spondylolisthesis. The disc spaces appear unremarkable. There is no appreciable facet arthropathy. Intrauterine device is present in the rightward aspect of the pelvis. IMPRESSION: Age uncertain slight anterior wedging of the T12 vertebral body. No other evidence of fracture. No spondylolisthesis. No apparent arthropathic change. Intrauterine device in rightward aspect of the pelvis. Electronically Signed   By: Bretta BangWilliam  Woodruff III M.D.   On: 10/23/2015 11:42   Dg Hip Unilat  With Pelvis 2-3 Views Right  10/23/2015  CLINICAL DATA:  Pain following fall EXAM: DG HIP  (WITH OR WITHOUT PELVIS) 2-3V RIGHT COMPARISON:  None. FINDINGS: Frontal pelvis as well as frontal and lateral right hip images were obtained. There is a comminuted fracture of the medial right ischium with several mildly displaced fracture fragments. No other fracture is evident. No dislocation. Hip joints appear symmetric bilaterally. Intrauterine device is in the rightward aspect of the mid-pelvis. IMPRESSION: Comminuted fracture of the medial right ischium with several displaced fracture fragments in this area. No other fracture evident. No dislocation. No apparent arthropathic change. Intrauterine device in rightward aspect of pelvis. Electronically Signed   By: Bretta BangWilliam  Woodruff III M.D.   On: 10/23/2015 11:40   I have personally reviewed and evaluated these images and lab results as part of my medical decision-making.   EKG Interpretation None      MDM   Final diagnoses:  Closed displaced fracture of right ischium, unspecified fracture morphology, initial encounter (HCC)    Labs: ft  Imaging: dg lumbar, dg hip uni lft   Consults: Dr. [facility]  Therapeutics: ibuprofen   Discharge Meds:  Assessment/Plan: [age] YOF presents today With complaints of back and hip pain status post mechanical fall. Patient has diffuse tenderness to the lower lumbar, this is not severe. Patient acutely tender to right anterior hip. She is able ambulate with some pain. Patient noted to have a pubic rami fracture. Results were read to Dr. Eulah PontMurphy of orthopedics instructed patient to follow-up in his office next week as this was a stable fracture with no indication for surgical management. Patient has no abdominal pain, urinary complaints, or any other focal tenderness other than those noted above. Patient will be discharged home with follow-up indications, pain medication, strict return precautions. Both patient and her husband verbalized understanding and agreement to today's plan and had no further  questions or concerns.        Eyvonne MechanicJeffrey Miyana Mordecai, PA-C 10/23/15 1240  Cathren LaineKevin Steinl, MD 10/25/15 [phone]

## 2015-10-23 NOTE — Discharge Instructions (Signed)
You have suffered a fracture today. Please use pain medication only as directed, avoid rigorous activity, return immediately if any new or worsening signs or symptoms present. Please contact Dr. Eulah PontMurphy and schedule follow-up evaluation next week for reevaluation.

## 2015-10-23 NOTE — ED Notes (Signed)
Pt c/o low back and R groin pain after a trip and fall this morning.  Pain score 10/10.  Pt has not taken anything for pain.  Pt has not attempted to ambulate since fall.

## 2015-12-30 ENCOUNTER — Other Ambulatory Visit: Payer: Self-pay | Admitting: Internal Medicine

## 2015-12-30 DIAGNOSIS — E2839 Other primary ovarian failure: Secondary | ICD-10-CM

## 2016-01-10 ENCOUNTER — Encounter (HOSPITAL_COMMUNITY): Payer: Self-pay

## 2016-01-10 DIAGNOSIS — I10 Essential (primary) hypertension: Secondary | ICD-10-CM | POA: Insufficient documentation

## 2016-01-10 DIAGNOSIS — E119 Type 2 diabetes mellitus without complications: Secondary | ICD-10-CM | POA: Insufficient documentation

## 2016-01-10 DIAGNOSIS — Z794 Long term (current) use of insulin: Secondary | ICD-10-CM | POA: Insufficient documentation

## 2016-01-10 DIAGNOSIS — Z79899 Other long term (current) drug therapy: Secondary | ICD-10-CM | POA: Insufficient documentation

## 2016-01-10 DIAGNOSIS — R21 Rash and other nonspecific skin eruption: Secondary | ICD-10-CM | POA: Diagnosis present

## 2016-01-10 DIAGNOSIS — Z7982 Long term (current) use of aspirin: Secondary | ICD-10-CM | POA: Insufficient documentation

## 2016-01-10 NOTE — ED Triage Notes (Signed)
Pt presents with c/o rash on her arms and her face. Pt reports the areas itch and reports that she has not been exposed to any new substance. Areas are red.

## 2016-01-11 ENCOUNTER — Emergency Department (HOSPITAL_COMMUNITY)
Admission: EM | Admit: 2016-01-11 | Discharge: 2016-01-11 | Disposition: A | Discharge status: Home / Self Care | Payer: Medicaid Other | Attending: Emergency Medicine | Admitting: Emergency Medicine

## 2016-01-11 DIAGNOSIS — R21 Rash and other nonspecific skin eruption: Secondary | ICD-10-CM

## 2016-01-11 MED ORDER — PREDNISONE 20 MG PO TABS
40.0000 mg | ORAL_TABLET | Freq: Once | ORAL | Status: AC
  Administered 2016-01-11: 40 mg via ORAL
  Filled 2016-01-11: qty 2

## 2016-01-11 MED ORDER — PREDNISONE 20 MG PO TABS: 40.0000 mg | ORAL_TABLET | Freq: Every day | ORAL | 0 refills | Status: DC

## 2016-01-11 NOTE — ED Provider Notes (Signed)
[facility]-EMERGENCY DEPT Provider Note   CSN: [id number] Arrival date & time: 01/10/16  2332     History   Chief Complaint Chief Complaint  Patient presents with  . Rash    HPI Karla Mack is a 50 y.o. female.  HPI Patient presents with a rash to her arms and face. Began 3 days ago. Mild pain and itching today but states began on her face and then moved down to her arms. No new exposures. No mouth involvement. No wheezes. No recent she knows why it was started. No new medicines. She has not had a rash like this before.   Past Medical History:  Diagnosis Date  . Diabetes mellitus   . Hypertension     Patient Active Problem List   Diagnosis Date Noted  . Essential hypertension, benign 01/04/2013  . DIABETES MELLITUS, TYPE II 10/17/2006  . HYPERLIPIDEMIA 10/17/2006    History reviewed. No pertinent surgical history.  OB History    Gravida Para Term Preterm AB Living   4 4 4     4    SAB TAB Ectopic Multiple Live Births                   Home Medications    Prior to Admission medications   Medication Sig Start Date End Date Taking? Authorizing Provider  amLODipine (NORVASC) 10 MG tablet Take 1 tablet (10 mg total) by mouth daily. 01/04/13   Ripudeep Jenna Luo, MD  aspirin 81 MG tablet Take 1 tablet (81 mg total) by mouth daily. Patient not taking: Reported on 05/13/2014 01/04/13   Ripudeep Jenna Luo, MD  canagliflozin (INVOKANA) 300 MG TABS tablet Take 300 mg by mouth daily before breakfast.    Historical Provider, MD  cetirizine (ZYRTEC) 10 MG tablet Take 1 tablet (10 mg total) by mouth daily as needed for allergies. 01/04/13   Ripudeep Jenna Luo, MD  hydrochlorothiazide (HYDRODIURIL) 25 MG tablet Take 1 tablet (25 mg total) by mouth daily. Patient not taking: Reported on 05/13/2014 01/04/13   Ripudeep Jenna Luo, MD  insulin aspart (NOVOLOG) 100 UNIT/ML injection Inject 10-15 Units into the skin 3 (three) times daily before meals. 10 units in the morning 15 units at lunch 10 units at  dinner 01/04/13   Ripudeep K Rai, MD  Insulin Glargine (LANTUS SOLOSTAR) 100 UNIT/ML SOPN Inject 70 Units into the skin at bedtime. 01/31/13   Quentin Angst, MD  levonorgestrel (MIRENA) 20 MCG/24HR IUD 1 each by Intrauterine route once.    Historical Provider, MD  lisinopril-hydrochlorothiazide (PRINZIDE,ZESTORETIC) 10-12.5 MG per tablet Take 1 tablet by mouth daily.    Historical Provider, MD  meloxicam (MOBIC) 7.5 MG tablet Take 7.5 mg by mouth daily.    Historical Provider, MD  metFORMIN (GLUCOPHAGE) 1000 MG tablet Take 1 tablet (1,000 mg total) by mouth 2 (two) times daily with a meal. 01/04/13   Ripudeep K Rai, MD  miconazole (MICOTIN) 2 % cream Apply topically 2 (two) times daily. Patient not taking: Reported on 05/13/2014 09/06/12   Gwyneth Sprout, MD  norethindrone-ethinyl estradiol (TRIPHASIL,CYCLAFEM,ALYACEN) 0.5/0.75/1-35 MG-MCG tablet Take 1 tablet by mouth daily. Patient not taking: Reported on 05/13/2014 01/04/13   Ripudeep Jenna Luo, MD  oxyCODONE-acetaminophen (PERCOCET/ROXICET) 5-325 MG tablet Take 2 tablets by mouth every 6 (six) hours as needed for severe pain. 10/23/15   Eyvonne Mechanic, PA-C  pravastatin (PRAVACHOL) 40 MG tablet Take 1 tablet (40 mg total) by mouth daily. Patient not taking: Reported on 05/13/2014 01/04/13  Ripudeep Jenna LuoK Rai, MD  predniSONE (DELTASONE) 20 MG tablet Take 2 tablets (40 mg total) by mouth daily. 01/11/16   Benjiman CoreNathan Leelan Rajewski, MD  simvastatin (ZOCOR) 20 MG tablet Take 20 mg by mouth daily.    Historical Provider, MD    Family History No family history on file.  Social History Social History  Substance Use Topics  . Smoking status: Never Smoker  . Smokeless tobacco: Never Used  . Alcohol use No     Allergies   Cholestatin   Review of Systems Review of Systems  Constitutional: Negative for appetite change, fatigue and fever.  Respiratory: Negative for shortness of breath.   Cardiovascular: Negative for chest pain.  Gastrointestinal: Negative  for abdominal pain.  Musculoskeletal: Negative for back pain.  Skin: Positive for rash.  Hematological: Negative for adenopathy.  Psychiatric/Behavioral: Negative for confusion.     Physical Exam Updated Vital Signs BP 140/77 (BP Location: Left Arm)   Pulse 101   Temp 98.1 F (36.7 C) (Oral)   Resp 18   SpO2 100%   Physical Exam  Constitutional: She appears well-developed.  HENT:  Normal mucous membranes.  Cardiovascular: Normal rate.   Pulmonary/Chest: Effort normal.  Musculoskeletal: She exhibits no edema.  Neurological: She is alert.  Skin: Skin is warm. Capillary refill takes less than 2 seconds.  Erythematous rash along the palmar aspect of forearm on right side and forearm and upper arm with sparing of the elbow on the left arm. Blanches. Somewhat defined margin. Similar rash on her cheeks.     ED Treatments / Results  Labs (all labs ordered are listed, but only abnormal results are displayed) Labs Reviewed - No data to display  EKG  EKG Interpretation None       Radiology No results found.  Procedures Procedures (including critical care time)  Medications Ordered in ED Medications  predniSONE (DELTASONE) tablet 40 mg (not administered)     Initial Impression / Assessment and Plan / ED Course  I have reviewed the triage vital signs and the nursing notes.  Pertinent labs & imaging results that were available during my care of the patient were reviewed by me and considered in my medical decision making (see chart for details).  Clinical Course    Patient rash to face and upper extremities. Some mild itching. No clear cause for it. Will give 3 day course of steroids and patient will follow-up with her PCP as needed. Final Clinical Impressions(s) / ED Diagnoses   Final diagnoses:  Rash and nonspecific skin eruption    New Prescriptions New Prescriptions   PREDNISONE (DELTASONE) 20 MG TABLET    Take 2 tablets (40 mg total) by mouth daily.       Benjiman CoreNathan Shalan Neault, MD 01/11/16 [phone]

## 2016-05-12 ENCOUNTER — Other Ambulatory Visit: Payer: Self-pay | Admitting: Internal Medicine

## 2016-05-12 DIAGNOSIS — Z1231 Encounter for screening mammogram for malignant neoplasm of breast: Secondary | ICD-10-CM

## 2016-06-01 ENCOUNTER — Ambulatory Visit: Payer: Medicaid Other

## 2016-06-01 ENCOUNTER — Ambulatory Visit
Admission: RE | Admit: 2016-06-01 | Discharge: 2016-06-01 | Disposition: A | Discharge status: Home / Self Care | Payer: Medicaid Other | Source: Ambulatory Visit | Attending: Internal Medicine | Admitting: Internal Medicine

## 2016-06-01 DIAGNOSIS — Z1231 Encounter for screening mammogram for malignant neoplasm of breast: Secondary | ICD-10-CM

## 2017-08-26 ENCOUNTER — Other Ambulatory Visit: Payer: Self-pay | Admitting: Internal Medicine

## 2017-08-26 DIAGNOSIS — Z1231 Encounter for screening mammogram for malignant neoplasm of breast: Secondary | ICD-10-CM

## 2017-09-26 ENCOUNTER — Ambulatory Visit
Admission: RE | Admit: 2017-09-26 | Discharge: 2017-09-26 | Disposition: A | Discharge status: Home / Self Care | Payer: Medicaid Other | Source: Ambulatory Visit | Attending: Internal Medicine | Admitting: Internal Medicine

## 2017-09-26 DIAGNOSIS — Z1231 Encounter for screening mammogram for malignant neoplasm of breast: Secondary | ICD-10-CM

## 2018-10-16 ENCOUNTER — Other Ambulatory Visit: Payer: Self-pay | Admitting: Internal Medicine

## 2018-10-16 DIAGNOSIS — Z1231 Encounter for screening mammogram for malignant neoplasm of breast: Secondary | ICD-10-CM

## 2018-11-22 ENCOUNTER — Ambulatory Visit: Payer: Medicaid Other | Admitting: Podiatry

## 2018-11-22 ENCOUNTER — Encounter: Payer: Self-pay | Admitting: Podiatry

## 2018-11-22 ENCOUNTER — Ambulatory Visit (INDEPENDENT_AMBULATORY_CARE_PROVIDER_SITE_OTHER): Payer: Medicaid Other

## 2018-11-22 ENCOUNTER — Other Ambulatory Visit: Payer: Self-pay | Admitting: Podiatry

## 2018-11-22 ENCOUNTER — Other Ambulatory Visit: Payer: Self-pay

## 2018-11-22 VITALS — BP 137/80 | HR 107 | Temp 98.0°F

## 2018-11-22 DIAGNOSIS — G629 Polyneuropathy, unspecified: Secondary | ICD-10-CM

## 2018-11-22 DIAGNOSIS — M76821 Posterior tibial tendinitis, right leg: Secondary | ICD-10-CM

## 2018-11-22 DIAGNOSIS — M2141 Flat foot [pes planus] (acquired), right foot: Secondary | ICD-10-CM | POA: Diagnosis not present

## 2018-11-22 DIAGNOSIS — E0843 Diabetes mellitus due to underlying condition with diabetic autonomic (poly)neuropathy: Secondary | ICD-10-CM

## 2018-11-25 NOTE — Progress Notes (Signed)
   HPI: 53 y.o. female with PMHx of T2DM presenting today as a new patient with a chief complaint of numbness to the lateral aspect of the bilateral feet that has been ongoing for the past few years. She states the numbness goes up the BLE. She has been evaluated by her PCP and prescribed Gabapentin which she has been taking as directed with some relief. There are no worsening factors noted. Patient is here for further evaluation and treatment.   Past Medical History:  Diagnosis Date  . Diabetes mellitus   . Hypertension      Physical Exam: General: The patient is alert and oriented x3 in no acute distress.  Dermatology: Skin is warm, dry and supple bilateral lower extremities. Negative for open lesions or macerations.  Vascular: Palpable pedal pulses bilaterally. No edema or erythema noted. Capillary refill within normal limits.  Neurological: Epicritic and protective threshold diminished bilaterally.   Musculoskeletal Exam: Pain on palpation noted to the posterior tibial tendon of the right foot with medial longitudinal arch collapse noted. Range of motion within normal limits to all pedal and ankle joints bilateral. Muscle strength 5/5 in all groups bilateral.   Assessment: 1. Diabetes mellitus type 2 - uncontrolled  2. Peripheral polyneuropathy BLE 3. PTTD right   Plan of Care:  1. Patient evaluated.   2. Appointment with Liliane Channel, Pedorthist, for DM shoes.  3. Continue taking Gabapentin 600 mg TID as directed by PCP.  4. Stressed importance of lowering blood glucose levels.  5. Return to clinic as needed.      Edrick Kins, DPM [facility]  Dr. Edrick Kins, DPM    [address], [place] [address]                Office [phone]  Fax [phone]

## 2018-11-28 ENCOUNTER — Other Ambulatory Visit: Payer: Self-pay

## 2018-11-28 ENCOUNTER — Ambulatory Visit: Payer: Medicaid Other | Admitting: Orthotics

## 2018-11-28 DIAGNOSIS — G629 Polyneuropathy, unspecified: Secondary | ICD-10-CM

## 2018-11-28 NOTE — Progress Notes (Signed)
Patient presents today upon recommendation Dr. Daylene Katayama for evaluation/assessment for possible bracing/DM shoes to address conditons of pes planus, planovalgus deformity, polyneuropathy b/l   R>L.  Upon assessment, Patient does have significant RF valgus deformity, medial longtitudinal arch collapse, medial talus shift, and pain upon palpation along posterior tendon, esp at plantar insertion. Right over left.   Several options were discussed to address condition; at this time patient will be referred to [facility] for further evaluation and treatment due to getting the best possible outcome through bracing.  The fact that she has Medicaid which [facility] accepts will help ease financial burden of treatment.  Dr. Doreene Burke will be consulted and asked to send referral Rx to [facility].   [employer], Pedorthist.

## 2018-11-29 ENCOUNTER — Ambulatory Visit
Admission: RE | Admit: 2018-11-29 | Discharge: 2018-11-29 | Disposition: A | Discharge status: Home / Self Care | Payer: Medicaid Other | Source: Ambulatory Visit | Attending: Internal Medicine | Admitting: Internal Medicine

## 2018-11-29 DIAGNOSIS — Z1231 Encounter for screening mammogram for malignant neoplasm of breast: Secondary | ICD-10-CM

## 2019-01-12 ENCOUNTER — Ambulatory Visit (HOSPITAL_COMMUNITY)
Admission: RE | Admit: 2019-01-12 | Discharge: 2019-01-12 | Disposition: A | Discharge status: Home / Self Care | Payer: Medicaid Other | Source: Ambulatory Visit | Attending: Family | Admitting: Family

## 2019-01-12 ENCOUNTER — Other Ambulatory Visit (HOSPITAL_COMMUNITY): Payer: Self-pay | Admitting: Internal Medicine

## 2019-01-12 ENCOUNTER — Other Ambulatory Visit: Payer: Self-pay

## 2019-01-12 DIAGNOSIS — I739 Peripheral vascular disease, unspecified: Secondary | ICD-10-CM

## 2019-03-06 ENCOUNTER — Other Ambulatory Visit: Payer: Self-pay

## 2019-03-06 ENCOUNTER — Emergency Department (HOSPITAL_COMMUNITY)
Admission: EM | Admit: 2019-03-06 | Discharge: 2019-03-07 | Disposition: A | Discharge status: Home / Self Care | Payer: Medicaid Other | Attending: Emergency Medicine | Admitting: Emergency Medicine

## 2019-03-06 DIAGNOSIS — Z794 Long term (current) use of insulin: Secondary | ICD-10-CM | POA: Insufficient documentation

## 2019-03-06 DIAGNOSIS — I10 Essential (primary) hypertension: Secondary | ICD-10-CM | POA: Insufficient documentation

## 2019-03-06 DIAGNOSIS — U071 COVID-19: Secondary | ICD-10-CM | POA: Insufficient documentation

## 2019-03-06 DIAGNOSIS — Z79899 Other long term (current) drug therapy: Secondary | ICD-10-CM | POA: Insufficient documentation

## 2019-03-06 DIAGNOSIS — Z20822 Contact with and (suspected) exposure to covid-19: Secondary | ICD-10-CM

## 2019-03-06 DIAGNOSIS — R519 Headache, unspecified: Secondary | ICD-10-CM | POA: Diagnosis present

## 2019-03-06 DIAGNOSIS — E1165 Type 2 diabetes mellitus with hyperglycemia: Secondary | ICD-10-CM | POA: Insufficient documentation

## 2019-03-06 DIAGNOSIS — Z7982 Long term (current) use of aspirin: Secondary | ICD-10-CM | POA: Diagnosis not present

## 2019-03-06 DIAGNOSIS — Z20828 Contact with and (suspected) exposure to other viral communicable diseases: Secondary | ICD-10-CM

## 2019-03-06 DIAGNOSIS — R739 Hyperglycemia, unspecified: Secondary | ICD-10-CM

## 2019-03-06 DIAGNOSIS — R058 Other specified cough: Secondary | ICD-10-CM

## 2019-03-06 LAB — URINALYSIS, ROUTINE W REFLEX MICROSCOPIC
Bilirubin Urine: NEGATIVE
Glucose, UA: >=500 mg/dL — AB
Hgb urine dipstick: NEGATIVE
Ketones, ur: NEGATIVE mg/dL
Leukocytes,Ua: NEGATIVE
Nitrite: NEGATIVE
Protein, ur: NEGATIVE mg/dL
Specific Gravity, Urine: 1.028 (ref 1.005–1.030)
pH: 6 (ref 5.0–8.0)

## 2019-03-06 LAB — COMPREHENSIVE METABOLIC PANEL
ALT: 11 U/L (ref 0–44)
AST: 16 U/L (ref 15–41)
Albumin: 3.5 g/dL (ref 3.5–5.0)
Alkaline Phosphatase: 88 U/L (ref 38–126)
Anion gap: 15 (ref 5–15)
BUN: 11 mg/dL (ref 6–20)
CO2: 19 mmol/L — ABNORMAL LOW (ref 22–32)
Calcium: 9.1 mg/dL (ref 8.9–10.3)
Chloride: 95 mmol/L — ABNORMAL LOW (ref 98–111)
Creatinine, Ser: 0.69 mg/dL (ref 0.44–1.00)
GFR calc Af Amer: >60 mL/min (ref >60)
GFR calc non Af Amer: >60 mL/min (ref >60)
Glucose, Bld: 591 mg/dL (ref 70–99)
Potassium: 4.1 mmol/L (ref 3.5–5.1)
Sodium: 129 mmol/L — ABNORMAL LOW (ref 135–145)
Total Bilirubin: 0.5 mg/dL (ref 0.3–1.2)
Total Protein: 7 g/dL (ref 6.5–8.1)

## 2019-03-06 LAB — LIPASE, BLOOD: Lipase: 29 U/L (ref 11–51)

## 2019-03-06 LAB — CBC
HCT: 36.9 % (ref 36.0–46.0)
Hemoglobin: 12.2 g/dL (ref 12.0–15.0)
MCH: 28.2 pg (ref 26.0–34.0)
MCHC: 33.1 g/dL (ref 30.0–36.0)
MCV: 85.2 fL (ref 80.0–100.0)
Platelets: 275 10*3/uL (ref 150–400)
RBC: 4.33 MIL/uL (ref 3.87–5.11)
RDW: 12.1 % (ref 11.5–15.5)
WBC: 6.5 10*3/uL (ref 4.0–10.5)
nRBC: 0 % (ref 0.0–0.2)

## 2019-03-06 LAB — I-STAT BETA HCG BLOOD, ED (MC, WL, AP ONLY): I-stat hCG, quantitative: <5 m[IU]/mL (ref <5)

## 2019-03-06 MED ORDER — ACETAMINOPHEN 500 MG PO TABS
1000.0000 mg | ORAL_TABLET | Freq: Once | ORAL | Status: AC
  Administered 2019-03-06: 1000 mg via ORAL
  Filled 2019-03-06: qty 2

## 2019-03-06 MED ORDER — SODIUM CHLORIDE 0.9% FLUSH: 3.0000 mL | Freq: Once | INTRAVENOUS | Status: DC

## 2019-03-06 NOTE — ED Triage Notes (Signed)
Pt here for evaluation of two days of cough, headache, sore throat, and diarrhea. Pt's husband tested positive for covid 2 days ago.

## 2019-03-06 NOTE — ED Notes (Signed)
Urine culture sent with UA 

## 2019-03-07 LAB — CBG MONITORING, ED
Glucose-Capillary: 434 mg/dL — ABNORMAL HIGH (ref 70–99)
Glucose-Capillary: 371 mg/dL — ABNORMAL HIGH (ref 70–99)

## 2019-03-07 LAB — SARS CORONAVIRUS 2 (TAT 6-24 HRS): SARS Coronavirus 2: POSITIVE — AB

## 2019-03-07 MED ORDER — ONDANSETRON 4 MG PO TBDP: 4.0000 mg | ORAL_TABLET | Freq: Three times a day (TID) | ORAL | 0 refills | Status: DC | PRN

## 2019-03-07 MED ORDER — INSULIN GLARGINE 100 UNIT/ML ~~LOC~~ SOLN
70.0000 [IU] | Freq: Once | SUBCUTANEOUS | Status: AC
  Administered 2019-03-07: 70 [IU] via SUBCUTANEOUS
  Filled 2019-03-07 (×3): qty 0.7

## 2019-03-07 MED ORDER — KETOROLAC TROMETHAMINE 30 MG/ML IJ SOLN: 30.0000 mg | Freq: Once | INTRAMUSCULAR | Status: DC

## 2019-03-07 MED ORDER — FENTANYL CITRATE (PF) 100 MCG/2ML IJ SOLN
50.0000 ug | Freq: Once | INTRAMUSCULAR | Status: AC
  Administered 2019-03-07: 50 ug via INTRAVENOUS
  Filled 2019-03-07: qty 2

## 2019-03-07 MED ORDER — INSULIN ASPART 100 UNIT/ML ~~LOC~~ SOLN
5.0000 [IU] | Freq: Once | SUBCUTANEOUS | Status: AC
  Administered 2019-03-07: 5 [IU] via INTRAVENOUS

## 2019-03-07 MED ORDER — ACETAMINOPHEN 325 MG PO TABS
650.0000 mg | ORAL_TABLET | Freq: Once | ORAL | Status: AC
  Administered 2019-03-07: 650 mg via ORAL
  Filled 2019-03-07: qty 2

## 2019-03-07 MED ORDER — ONDANSETRON HCL 4 MG/2ML IJ SOLN
4.0000 mg | Freq: Once | INTRAMUSCULAR | Status: AC
  Administered 2019-03-07: 4 mg via INTRAVENOUS
  Filled 2019-03-07: qty 2

## 2019-03-07 MED ORDER — MAGIC MOUTHWASH W/LIDOCAINE: 5.0000 mL | Freq: Four times a day (QID) | ORAL | 0 refills | Status: AC | PRN

## 2019-03-07 MED ORDER — BENZONATATE 100 MG PO CAPS: 100.0000 mg | ORAL_CAPSULE | Freq: Three times a day (TID) | ORAL | 0 refills | Status: AC

## 2019-03-07 MED ORDER — SODIUM CHLORIDE 0.9 % IV BOLUS
1000.0000 mL | Freq: Once | INTRAVENOUS | Status: AC
  Administered 2019-03-07: 1000 mL via INTRAVENOUS

## 2019-03-07 NOTE — ED Provider Notes (Signed)
[facility] EMERGENCY DEPARTMENT Provider Note   CSN: [id number] Arrival date & time: 03/06/19  1747     History   Chief Complaint Chief Complaint  Patient presents with  . Headache  . Diarrhea  . Cough    HPI Karla Mack is a 53 y.o. female.     Patient with history of T2DM, HTN presents with symptoms of headache, sore throat, cough, SOB, nausea, diarrhea x 2 days. She denies known fever. She reports her husband tested positive for COVID when seen 3 days ago. She reports elevation in her blood sugar as well but has been taking her insulin as prescribed.   The history is provided by the patient. A language interpreter was used (family at bedside).  Headache Associated symptoms: cough, diarrhea, myalgias, nausea and sore throat   Associated symptoms: no congestion and no fever   Diarrhea Associated symptoms: headaches and myalgias   Associated symptoms: no chills and no fever   Cough Associated symptoms: headaches, myalgias, shortness of breath and sore throat   Associated symptoms: no chills and no fever     Past Medical History:  Diagnosis Date  . Diabetes mellitus   . Hypertension     Patient Active Problem List   Diagnosis Date Noted  . Essential hypertension, benign 01/04/2013  . DIABETES MELLITUS, TYPE II 10/17/2006  . HYPERLIPIDEMIA 10/17/2006    No past surgical history on file.   OB History    Gravida  4   Para  4   Term  4   Preterm      AB      Living  4     SAB      TAB      Ectopic      Multiple      Live Births               Home Medications    Prior to Admission medications   Medication Sig Start Date End Date Taking? Authorizing Provider  ACCU-CHEK GUIDE test strip FOR TESTING 3 TIMES DAILY 11/13/18   [provider]  amLODipine (NORVASC) 10 MG tablet Take 1 tablet (10 mg total) by mouth daily. 01/04/13   Rai, Vernelle Emerald, MD  aspirin 81 MG tablet Take 1 tablet (81 mg  total) by mouth daily. 01/04/13   Rai, Vernelle Emerald, MD  canagliflozin (INVOKANA) 300 MG TABS tablet Take 300 mg by mouth daily before breakfast.    [provider]  cetirizine (ZYRTEC) 10 MG tablet Take 1 tablet (10 mg total) by mouth daily as needed for allergies. 01/04/13   Rai, Vernelle Emerald, MD  diclofenac (VOLTAREN) 75 MG EC tablet TK 1 T PO BID WF PRN 11/03/18   [provider]  gabapentin (NEURONTIN) 300 MG capsule TK 1 C PO TID 11/03/18   [provider]  hydrochlorothiazide (HYDRODIURIL) 25 MG tablet Take 1 tablet (25 mg total) by mouth daily. 01/04/13   Rai, Ripudeep K, MD  insulin aspart (NOVOLOG) 100 UNIT/ML injection Inject 10-15 Units into the skin 3 (three) times daily before meals. 10 units in the morning 15 units at lunch 10 units at dinner 01/04/13   Rai, Ripudeep K, MD  Insulin Glargine (LANTUS SOLOSTAR) 100 UNIT/ML SOPN Inject 70 Units into the skin at bedtime. 01/31/13   Tresa Garter, MD  levonorgestrel (MIRENA) 20 MCG/24HR IUD 1 each by Intrauterine route once.    [provider]  lisinopril-hydrochlorothiazide (PRINZIDE,ZESTORETIC) 10-12.5 MG per  tablet Take 1 tablet by mouth daily.    [provider]  meloxicam (MOBIC) 7.5 MG tablet Take 7.5 mg by mouth daily.    [provider]  metFORMIN (GLUCOPHAGE) 1000 MG tablet Take 1 tablet (1,000 mg total) by mouth 2 (two) times daily with a meal. 01/04/13   Rai, Ripudeep K, MD  miconazole (MICOTIN) 2 % cream Apply topically 2 (two) times daily. 09/06/12   Gwyneth Sprout, MD  norethindrone-ethinyl estradiol (TRIPHASIL,CYCLAFEM,ALYACEN) 0.5/0.75/1-35 MG-MCG tablet Take 1 tablet by mouth daily. 01/04/13   Rai, Delene Ruffini, MD  oxyCODONE-acetaminophen (PERCOCET/ROXICET) 5-325 MG tablet Take 2 tablets by mouth every 6 (six) hours as needed for severe pain. 10/23/15   Hedges, Tinnie Gens, PA-C  pravastatin (PRAVACHOL) 40 MG tablet Take 1 tablet (40 mg total) by mouth daily. 01/04/13   Rai, Delene Ruffini, MD  predniSONE (DELTASONE) 20 MG tablet Take 2 tablets (40 mg total) by mouth daily. 01/11/16   Benjiman Core, MD  simvastatin (ZOCOR) 40 MG tablet TK 1 T PO QD 09/14/18   [provider]  tiZANidine (ZANAFLEX) 4 MG tablet TAKE 1 TABLET BY MOUTH TWICE DAILY AS NEEDED FOR NECK PAINS 11/01/18   [provider]    Family History Family History  Problem Relation Age of Onset  . Breast cancer Neg Hx     Social History Social History   Tobacco Use  . Smoking status: Never Smoker  . Smokeless tobacco: Never Used  Substance Use Topics  . Alcohol use: No  . Drug use: No     Allergies   Cholestatin   Review of Systems Review of Systems  Constitutional: Negative for chills and fever.  HENT: Positive for sore throat. Negative for congestion.   Respiratory: Positive for cough and shortness of breath.   Cardiovascular: Negative.   Gastrointestinal: Positive for diarrhea and nausea.  Musculoskeletal: Positive for myalgias.  Skin: Negative.   Neurological: Positive for headaches.     Physical Exam Updated Vital Signs BP 108/72 (BP Location: Right Arm)   Pulse (!) 106   Temp 100.2 F (37.9 C) (Oral)   Resp 16   SpO2 99%   Physical Exam Vitals signs and nursing note reviewed.  Constitutional:      Appearance: She is well-developed. She is not toxic-appearing.  HENT:     Head: Normocephalic.     Mouth/Throat:     Mouth: Mucous membranes are moist.  Neck:     Musculoskeletal: Normal range of motion and neck supple.  Cardiovascular:     Rate and Rhythm: Normal rate and regular rhythm.     Heart sounds: No murmur.  Pulmonary:     Effort: Pulmonary effort is normal.     Breath sounds: Normal breath sounds. No wheezing, rhonchi or rales.  Abdominal:     General: Bowel sounds are normal.     Palpations: Abdomen is soft.     Tenderness: There is no abdominal tenderness. There is no guarding or rebound.  Musculoskeletal: Normal range of motion.  Skin:     General: Skin is warm and dry.     Findings: No rash.  Neurological:     Mental Status: She is alert and oriented to person, place, and time.     GCS: GCS eye subscore is 4. GCS verbal subscore is 5. GCS motor subscore is 6.     Cranial Nerves: No dysarthria.     Motor: No weakness.      ED Treatments / Results  Labs (all labs ordered are listed, but only abnormal results are displayed) Labs Reviewed  COMPREHENSIVE METABOLIC PANEL - Abnormal; Notable for the following components:      Result Value   Sodium 129 (*)    Chloride 95 (*)    CO2 19 (*)    Glucose, Bld 591 (*)    All other components within normal limits  URINALYSIS, ROUTINE W REFLEX MICROSCOPIC - Abnormal; Notable for the following components:   APPearance HAZY (*)    Glucose, UA >=500 (*)    Bacteria, UA RARE (*)    All other components within normal limits  LIPASE, BLOOD  CBC  I-STAT BETA HCG BLOOD, ED ([facility], [facility], [facility] ONLY)  CBG MONITORING, ED    EKG EKG Interpretation  Date/Time:  Tuesday March 06 2019 18:28:25 EST Ventricular Rate:  134 PR Interval:  134 QRS Duration: 68 QT Interval:  296 QTC Calculation: 442 R Axis:   115 Text Interpretation: Sinus tachycardia Left posterior fascicular block Cannot rule out Anterior infarct , age undetermined Abnormal ECG No significant change since last tracing Confirmed by Drema Pryardama, Pedro [phone]([id number]) on 03/07/2019 3:30:11 AM   Radiology No results found.  Procedures Procedures (including critical care time)  Medications Ordered in ED Medications  sodium chloride flush (NS) 0.9 % injection 3 mL (has no administration in time range)  sodium chloride 0.9 % bolus 1,000 mL (has no administration in time range)  acetaminophen (TYLENOL) tablet 1,000 mg (1,000 mg Oral Given 03/06/19 1829)     Initial Impression / Assessment and Plan / ED Course  I have reviewed the triage vital signs and the nursing notes.  Pertinent labs & imaging results that were available  during my care of the patient were reviewed by me and considered in my medical decision making (see chart for details).        Patient to ED after direct COVID exposure (husband) with symptoms of HA, cough, sore throat, body aches, nausea and diarrhea.   She is nontoxic in appearance. On arrival her CBG was 591, no evidence of DKA. No vomiting. Drinking fluids. She did not take her insulin at dinner or Lantus last evening as she was here in the emergency department and had not eaten.   Patient is considered likely positive for COVID. Precautions set. Tier 2 collected and is pending. No hypoxia, respiratory difficulty. She is felt appropriate for discharge home.   Blood sugar decreases with IVF's to 381. Small dose SQ insulin given (5 U). She is directed to continue her regular dosing in the morning with breakfast.     Final Clinical Impressions(s) / ED Diagnoses   Final diagnoses:  None   1. Hyperglycemia 2. COVID Exposure  ED Discharge Orders    None       Elpidio AnisUpstill, Patrik Turnbaugh, PA-C 03/07/19 [id number]    Nira Connardama, Pedro Eduardo, MD 03/07/19 [phone]

## 2019-03-07 NOTE — ED Notes (Signed)
Patient verbalizes understanding of discharge instructions. Opportunity for questioning and answers were provided. Armband removed by staff, pt discharged from ED.  

## 2019-03-07 NOTE — Discharge Instructions (Addendum)
Please check your blood sugars frequently throughout the day. You can resume your regular insulin dosing after breakfast this morning.   Treatment for COVID is symptomatic. You have been given prescriptions for cough medication, Zofran for nausea and a gargle rinse for sore throat comfort.

## 2019-03-08 LAB — NOVEL CORONAVIRUS, NAA: SARS-CoV-2, NAA: DETECTED — AB

## 2019-03-13 ENCOUNTER — Other Ambulatory Visit: Payer: Self-pay | Admitting: Critical Care Medicine

## 2019-03-13 DIAGNOSIS — I10 Essential (primary) hypertension: Secondary | ICD-10-CM

## 2019-03-13 DIAGNOSIS — U071 COVID-19: Secondary | ICD-10-CM

## 2019-03-13 DIAGNOSIS — E1165 Type 2 diabetes mellitus with hyperglycemia: Secondary | ICD-10-CM

## 2019-03-13 NOTE — Progress Notes (Signed)
  I connected by phone with Karla Mack on 03/13/2019 at 12:32 PM to discuss the potential use of an new treatment for mild to moderate COVID-19 viral infection in non-hospitalized patients. Note I used arabic interpreter Seif and also patients daughter [name] who spoke english  This patient is a 53 y.o. female that meets the FDA criteria for Emergency Use Authorization of bamlanivimab:  Has a (+) direct SARS-CoV-2 viral test result  Has mild or moderate COVID-19   Is ? 54 years of age and weighs ? 40 kg  Is NOT hospitalized due to COVID-19  Is NOT requiring oxygen therapy or requiring an increase in baseline oxygen flow rate due to COVID-19  Is within 10 days of symptom onset  Has at least one of the high risk factor(s) for progression to severe COVID-19 and/or hospitalization as defined in EUA.  Specific high risk criteria : Diabetes    Patient Active Problem List   Diagnosis Date Noted  . COVID-19 virus infection 03/13/2019  . Essential hypertension, benign 01/04/2013  . Uncontrolled type 2 diabetes mellitus ([facility]) 10/17/2006  . HYPERLIPIDEMIA 10/17/2006    I have spoken and communicated the following to the patient or parent/caregiver:  1. FDA has authorized the emergency use of bamlanivimab for the treatment of mild to moderate COVID-19 in adults and pediatric patients with positive results of direct SARS-CoV-2 viral testing who are 43 years of age and older weighing at least 40 kg, and who are at high risk for progressing to severe COVID-19 and/or hospitalization.  2. The significant known and potential risks and benefits of bamlanivimab, and the extent to which such potential risks and benefits are unknown.  3. Information on available alternative treatments and the risks and benefits of those alternatives, including clinical trials.  4. Patients treated with bamlanivimab should continue to self-isolate and use infection control measures (e.g., wear mask,  isolate, social distance, avoid sharing personal items, clean and disinfect "high touch" surfaces, and frequent handwashing) according to CDC guidelines.   5. The patient or parent/caregiver has the option to accept or refuse bamlanivimab.  After reviewing this information with the patient, The patient agreed to proceed with receiving the infusion of bamlanivimab and will be provided a copy of the Fact sheet prior to receiving the infusion.  Asencion Noble 03/13/2019 12:32 PM

## 2019-03-14 ENCOUNTER — Ambulatory Visit (HOSPITAL_COMMUNITY)
Admission: RE | Admit: 2019-03-14 | Discharge: 2019-03-14 | Disposition: A | Discharge status: Home / Self Care | Payer: Medicaid Other | Source: Ambulatory Visit | Attending: Critical Care Medicine | Admitting: Critical Care Medicine

## 2019-03-14 DIAGNOSIS — E785 Hyperlipidemia, unspecified: Secondary | ICD-10-CM | POA: Diagnosis not present

## 2019-03-14 DIAGNOSIS — U071 COVID-19: Secondary | ICD-10-CM | POA: Insufficient documentation

## 2019-03-14 DIAGNOSIS — I10 Essential (primary) hypertension: Secondary | ICD-10-CM | POA: Diagnosis not present

## 2019-03-14 DIAGNOSIS — E119 Type 2 diabetes mellitus without complications: Secondary | ICD-10-CM | POA: Diagnosis not present

## 2019-03-14 MED ORDER — DIPHENHYDRAMINE HCL 50 MG/ML IJ SOLN: 50.0000 mg | Freq: Once | INTRAMUSCULAR | Status: DC | PRN

## 2019-03-14 MED ORDER — EPINEPHRINE 0.3 MG/0.3ML IJ SOAJ: 0.3000 mg | Freq: Once | INTRAMUSCULAR | Status: DC | PRN

## 2019-03-14 MED ORDER — METHYLPREDNISOLONE SODIUM SUCC 125 MG IJ SOLR: 125.0000 mg | Freq: Once | INTRAMUSCULAR | Status: DC | PRN

## 2019-03-14 MED ORDER — SODIUM CHLORIDE 0.9 % IV SOLN
700.0000 mg | Freq: Once | INTRAVENOUS | Status: AC
  Administered 2019-03-14: 14:00:00 700 mg via INTRAVENOUS
  Filled 2019-03-14: qty 20

## 2019-03-14 MED ORDER — FAMOTIDINE IN NACL 20-0.9 MG/50ML-% IV SOLN: 20.0000 mg | Freq: Once | INTRAVENOUS | Status: DC | PRN

## 2019-03-14 MED ORDER — ALBUTEROL SULFATE (2.5 MG/3ML) 0.083% IN NEBU: 2.5000 mg | INHALATION_SOLUTION | Freq: Once | RESPIRATORY_TRACT | Status: DC | PRN

## 2019-03-14 MED ORDER — SODIUM CHLORIDE 0.9 % IV SOLN
INTRAVENOUS | Status: DC | PRN
  Administered 2019-03-14: 1000 mL via INTRAVENOUS

## 2019-03-14 NOTE — Progress Notes (Signed)
Pt with ongoing Bamlanivinab IV ,pt tolerated well with no  complaints at this time.

## 2019-03-14 NOTE — Progress Notes (Addendum)
  Diagnosis: COVID-19  Physician:Patrick Joya Gaskins MD  Procedure: bamlanivimab infusion Provided patient with bamlanivimab fact sheet for patients, parents and caregivers prior to infusion.  Complications: No immediate complications noted.  Discharge: Discharged home   Scotty Court 03/14/2019

## 2019-03-20 NOTE — Progress Notes (Signed)
The following conditions were under active treatment when I made the progress note entry on 11/25: Hypertension, uncontrolled diabetes mellitis type 2 and hyperlipidemia"

## 2019-04-10 ENCOUNTER — Other Ambulatory Visit: Payer: Medicaid Other

## 2019-04-18 ENCOUNTER — Other Ambulatory Visit: Payer: Self-pay | Admitting: Cardiology

## 2019-04-18 DIAGNOSIS — Z20822 Contact with and (suspected) exposure to covid-19: Secondary | ICD-10-CM

## 2019-04-20 LAB — NOVEL CORONAVIRUS, NAA: SARS-CoV-2, NAA: NOT DETECTED

## 2019-07-09 ENCOUNTER — Ambulatory Visit: Payer: Medicaid Other | Attending: Internal Medicine

## 2019-07-09 DIAGNOSIS — Z20822 Contact with and (suspected) exposure to covid-19: Secondary | ICD-10-CM

## 2019-07-10 LAB — SARS-COV-2, NAA 2 DAY TAT

## 2019-07-10 LAB — NOVEL CORONAVIRUS, NAA: SARS-CoV-2, NAA: NOT DETECTED

## 2020-01-01 ENCOUNTER — Other Ambulatory Visit: Payer: Self-pay | Admitting: Internal Medicine

## 2020-01-01 DIAGNOSIS — Z1231 Encounter for screening mammogram for malignant neoplasm of breast: Secondary | ICD-10-CM

## 2020-01-02 ENCOUNTER — Other Ambulatory Visit: Payer: Self-pay

## 2020-01-02 ENCOUNTER — Ambulatory Visit (INDEPENDENT_AMBULATORY_CARE_PROVIDER_SITE_OTHER): Payer: Medicaid Other

## 2020-01-02 ENCOUNTER — Ambulatory Visit (INDEPENDENT_AMBULATORY_CARE_PROVIDER_SITE_OTHER): Payer: Medicaid Other | Admitting: Podiatry

## 2020-01-02 DIAGNOSIS — S9032XA Contusion of left foot, initial encounter: Secondary | ICD-10-CM

## 2020-01-02 DIAGNOSIS — G629 Polyneuropathy, unspecified: Secondary | ICD-10-CM

## 2020-01-02 DIAGNOSIS — S91202A Unspecified open wound of left great toe with damage to nail, initial encounter: Secondary | ICD-10-CM

## 2020-01-02 DIAGNOSIS — M2141 Flat foot [pes planus] (acquired), right foot: Secondary | ICD-10-CM

## 2020-01-02 DIAGNOSIS — M76821 Posterior tibial tendinitis, right leg: Secondary | ICD-10-CM

## 2020-01-02 DIAGNOSIS — E0843 Diabetes mellitus due to underlying condition with diabetic autonomic (poly)neuropathy: Secondary | ICD-10-CM

## 2020-01-02 DIAGNOSIS — S9031XA Contusion of right foot, initial encounter: Secondary | ICD-10-CM | POA: Diagnosis not present

## 2020-01-03 ENCOUNTER — Ambulatory Visit
Admission: RE | Admit: 2020-01-03 | Discharge: 2020-01-03 | Disposition: A | Discharge status: Home / Self Care | Payer: Medicaid Other | Source: Ambulatory Visit | Attending: Internal Medicine | Admitting: Internal Medicine

## 2020-01-03 DIAGNOSIS — Z1231 Encounter for screening mammogram for malignant neoplasm of breast: Secondary | ICD-10-CM

## 2020-01-07 ENCOUNTER — Emergency Department (HOSPITAL_COMMUNITY)
Admission: EM | Admit: 2020-01-07 | Discharge: 2020-01-07 | Disposition: A | Discharge status: Home / Self Care | Payer: Medicaid Other | Attending: Emergency Medicine | Admitting: Emergency Medicine

## 2020-01-07 ENCOUNTER — Encounter (HOSPITAL_COMMUNITY): Payer: Self-pay | Admitting: Emergency Medicine

## 2020-01-07 ENCOUNTER — Other Ambulatory Visit: Payer: Self-pay

## 2020-01-07 DIAGNOSIS — Z79899 Other long term (current) drug therapy: Secondary | ICD-10-CM | POA: Diagnosis not present

## 2020-01-07 DIAGNOSIS — E119 Type 2 diabetes mellitus without complications: Secondary | ICD-10-CM | POA: Diagnosis not present

## 2020-01-07 DIAGNOSIS — I10 Essential (primary) hypertension: Secondary | ICD-10-CM | POA: Diagnosis not present

## 2020-01-07 DIAGNOSIS — Z7982 Long term (current) use of aspirin: Secondary | ICD-10-CM | POA: Insufficient documentation

## 2020-01-07 DIAGNOSIS — R509 Fever, unspecified: Secondary | ICD-10-CM | POA: Diagnosis present

## 2020-01-07 DIAGNOSIS — Z794 Long term (current) use of insulin: Secondary | ICD-10-CM | POA: Insufficient documentation

## 2020-01-07 DIAGNOSIS — Z20822 Contact with and (suspected) exposure to covid-19: Secondary | ICD-10-CM | POA: Diagnosis not present

## 2020-01-07 DIAGNOSIS — N3000 Acute cystitis without hematuria: Secondary | ICD-10-CM | POA: Diagnosis not present

## 2020-01-07 DIAGNOSIS — Z8616 Personal history of COVID-19: Secondary | ICD-10-CM | POA: Diagnosis not present

## 2020-01-07 LAB — URINALYSIS, ROUTINE W REFLEX MICROSCOPIC
Bilirubin Urine: NEGATIVE
Glucose, UA: NEGATIVE mg/dL
Hgb urine dipstick: NEGATIVE
Ketones, ur: NEGATIVE mg/dL
Nitrite: NEGATIVE
Protein, ur: NEGATIVE mg/dL
Specific Gravity, Urine: 1.023 (ref 1.005–1.030)
WBC, UA: >50 WBC/hpf — ABNORMAL HIGH (ref 0–5)
pH: 5 (ref 5.0–8.0)

## 2020-01-07 LAB — COMPREHENSIVE METABOLIC PANEL
ALT: 11 U/L (ref 0–44)
AST: 14 U/L — ABNORMAL LOW (ref 15–41)
Albumin: 3.8 g/dL (ref 3.5–5.0)
Alkaline Phosphatase: 78 U/L (ref 38–126)
Anion gap: 12 (ref 5–15)
BUN: 19 mg/dL (ref 6–20)
CO2: 23 mmol/L (ref 22–32)
Calcium: 9.9 mg/dL (ref 8.9–10.3)
Chloride: 99 mmol/L (ref 98–111)
Creatinine, Ser: 0.61 mg/dL (ref 0.44–1.00)
GFR calc Af Amer: >60 mL/min (ref >60)
GFR calc non Af Amer: >60 mL/min (ref >60)
Glucose, Bld: 184 mg/dL — ABNORMAL HIGH (ref 70–99)
Potassium: 4.6 mmol/L (ref 3.5–5.1)
Sodium: 134 mmol/L — ABNORMAL LOW (ref 135–145)
Total Bilirubin: 0.6 mg/dL (ref 0.3–1.2)
Total Protein: 7.3 g/dL (ref 6.5–8.1)

## 2020-01-07 LAB — CBC WITH DIFFERENTIAL/PLATELET
Abs Immature Granulocytes: 0.02 10*3/uL (ref 0.00–0.07)
Basophils Absolute: 0 10*3/uL (ref 0.0–0.1)
Basophils Relative: 0 %
Eosinophils Absolute: 0.2 10*3/uL (ref 0.0–0.5)
Eosinophils Relative: 3 %
HCT: 35.1 % — ABNORMAL LOW (ref 36.0–46.0)
Hemoglobin: 11.3 g/dL — ABNORMAL LOW (ref 12.0–15.0)
Immature Granulocytes: 0 %
Lymphocytes Relative: 46 %
Lymphs Abs: 3.5 10*3/uL (ref 0.7–4.0)
MCH: 27.4 pg (ref 26.0–34.0)
MCHC: 32.2 g/dL (ref 30.0–36.0)
MCV: 85.2 fL (ref 80.0–100.0)
Monocytes Absolute: 0.6 10*3/uL (ref 0.1–1.0)
Monocytes Relative: 7 %
Neutro Abs: 3.5 10*3/uL (ref 1.7–7.7)
Neutrophils Relative %: 44 %
Platelets: 339 10*3/uL (ref 150–400)
RBC: 4.12 MIL/uL (ref 3.87–5.11)
RDW: 12.2 % (ref 11.5–15.5)
WBC: 7.8 10*3/uL (ref 4.0–10.5)
nRBC: 0 % (ref 0.0–0.2)

## 2020-01-07 LAB — SARS CORONAVIRUS 2 BY RT PCR (HOSPITAL ORDER, PERFORMED IN ~~LOC~~ HOSPITAL LAB): SARS Coronavirus 2: NEGATIVE

## 2020-01-07 MED ORDER — CEFTRIAXONE SODIUM 1 G IJ SOLR: 1.0000 g | Freq: Once | INTRAMUSCULAR | Status: DC

## 2020-01-07 MED ORDER — SULFAMETHOXAZOLE-TRIMETHOPRIM 800-160 MG PO TABS
1.0000 | ORAL_TABLET | Freq: Once | ORAL | Status: AC
  Administered 2020-01-07: 1 via ORAL
  Filled 2020-01-07: qty 1

## 2020-01-07 MED ORDER — SULFAMETHOXAZOLE-TRIMETHOPRIM 800-160 MG PO TABS: 1.0000 | ORAL_TABLET | Freq: Two times a day (BID) | ORAL | 0 refills | Status: AC

## 2020-01-07 NOTE — ED Provider Notes (Signed)
[facility] EMERGENCY DEPARTMENT Provider Note   CSN: [id number] Arrival date & time: 01/07/20  1446     History Chief Complaint  Patient presents with  . Fever    Karla Mack is a 54 y.o. female.  Pt presents to the ED with fevers and chills.  She started having sx yesterday.  Pt has had Covid and has been vaccinated.         Past Medical History:  Diagnosis Date  . Diabetes mellitus   . Hypertension     Patient Active Problem List   Diagnosis Date Noted  . COVID-19 virus infection 03/13/2019  . Essential hypertension, benign 01/04/2013  . Uncontrolled type 2 diabetes mellitus ([facility]) 10/17/2006  . HYPERLIPIDEMIA 10/17/2006    History reviewed. No pertinent surgical history.   OB History    Gravida  4   Para  4   Term  4   Preterm      AB      Living  4     SAB      TAB      Ectopic      Multiple      Live Births              Family History  Problem Relation Age of Onset  . Breast cancer Neg Hx     Social History   Tobacco Use  . Smoking status: Never Smoker  . Smokeless tobacco: Never Used  Substance Use Topics  . Alcohol use: No  . Drug use: No    Home Medications Prior to Admission medications   Medication Sig Start Date End Date Taking? Authorizing Provider  ACCU-CHEK GUIDE test strip FOR TESTING 3 TIMES DAILY 11/13/18   [provider]  amLODipine (NORVASC) 10 MG tablet Take 1 tablet (10 mg total) by mouth daily. 01/04/13   Rai, Delene Ruffini, MD  aspirin 81 MG tablet Take 1 tablet (81 mg total) by mouth daily. 01/04/13   Rai, Delene Ruffini, MD  benzonatate (TESSALON) 100 MG capsule Take 1 capsule (100 mg total) by mouth every 8 (eight) hours. 03/07/19   Elpidio Anis, PA-C  canagliflozin (INVOKANA) 300 MG TABS tablet Take 300 mg by mouth daily before breakfast.    [provider]  cetirizine (ZYRTEC) 10 MG tablet Take 1 tablet (10 mg total) by mouth daily as needed for  allergies. 01/04/13   Rai, Delene Ruffini, MD  diclofenac (VOLTAREN) 75 MG EC tablet TK 1 T PO BID WF PRN 11/03/18   [provider]  gabapentin (NEURONTIN) 300 MG capsule TK 1 C PO TID 11/03/18   [provider]  hydrochlorothiazide (HYDRODIURIL) 25 MG tablet Take 1 tablet (25 mg total) by mouth daily. 01/04/13   Rai, Ripudeep K, MD  insulin aspart (NOVOLOG) 100 UNIT/ML injection Inject 10-15 Units into the skin 3 (three) times daily before meals. 10 units in the morning 15 units at lunch 10 units at dinner 01/04/13   Rai, Ripudeep K, MD  Insulin Glargine (LANTUS SOLOSTAR) 100 UNIT/ML SOPN Inject 70 Units into the skin at bedtime. 01/31/13   Quentin Angst, MD  levonorgestrel (MIRENA) 20 MCG/24HR IUD 1 each by Intrauterine route once.    [provider]  lisinopril-hydrochlorothiazide (PRINZIDE,ZESTORETIC) 10-12.5 MG per tablet Take 1 tablet by mouth daily.    [provider]  magic mouthwash w/lidocaine SOLN Take 5 mLs by mouth 4 (four) times daily as needed for mouth pain. 03/07/19  Elpidio Anis, PA-C  meloxicam (MOBIC) 7.5 MG tablet Take 7.5 mg by mouth daily.    [provider]  metFORMIN (GLUCOPHAGE) 1000 MG tablet Take 1 tablet (1,000 mg total) by mouth 2 (two) times daily with a meal. 01/04/13   Rai, Ripudeep K, MD  miconazole (MICOTIN) 2 % cream Apply topically 2 (two) times daily. 09/06/12   Gwyneth Sprout, MD  norethindrone-ethinyl estradiol (TRIPHASIL,CYCLAFEM,ALYACEN) 0.5/0.75/1-35 MG-MCG tablet Take 1 tablet by mouth daily. 01/04/13   Rai, Ripudeep K, MD  ondansetron (ZOFRAN ODT) 4 MG disintegrating tablet Take 1 tablet (4 mg total) by mouth every 8 (eight) hours as needed for nausea or vomiting. 03/07/19   Elpidio Anis, PA-C  oxyCODONE-acetaminophen (PERCOCET/ROXICET) 5-325 MG tablet Take 2 tablets by mouth every 6 (six) hours as needed for severe pain. 10/23/15   Hedges, Tinnie Gens, PA-C  pravastatin (PRAVACHOL) 40 MG tablet Take 1 tablet (40  mg total) by mouth daily. 01/04/13   Rai, Delene Ruffini, MD  predniSONE (DELTASONE) 20 MG tablet Take 2 tablets (40 mg total) by mouth daily. 01/11/16   Benjiman Core, MD  simvastatin (ZOCOR) 40 MG tablet TK 1 T PO QD 09/14/18   [provider]  sulfamethoxazole-trimethoprim (BACTRIM DS) 800-160 MG tablet Take 1 tablet by mouth 2 (two) times daily for 7 days. 01/07/20 01/14/20  Jacalyn Lefevre, MD  tiZANidine (ZANAFLEX) 4 MG tablet TAKE 1 TABLET BY MOUTH TWICE DAILY AS NEEDED FOR NECK PAINS 11/01/18   [provider]    Allergies    Cholestatin  Review of Systems   Review of Systems  Constitutional: Positive for chills and fever.  All other systems reviewed and are negative.   Physical Exam Updated Vital Signs BP 127/65 (BP Location: Right Arm)   Pulse (!) 114   Temp 99 F (37.2 C) (Oral)   Resp 18   Ht 5\' 6"  (1.676 m)   Wt 73.5 kg   SpO2 99%   BMI 26.15 kg/m   Physical Exam Vitals and nursing note reviewed.  Constitutional:      Appearance: Normal appearance.  HENT:     Head: Normocephalic and atraumatic.     Right Ear: External ear normal.     Left Ear: External ear normal.     Nose: Nose normal.     Mouth/Throat:     Mouth: Mucous membranes are moist.     Pharynx: Oropharynx is clear.  Eyes:     Extraocular Movements: Extraocular movements intact.     Conjunctiva/sclera: Conjunctivae normal.     Pupils: Pupils are equal, round, and reactive to light.  Cardiovascular:     Rate and Rhythm: Normal rate and regular rhythm.     Pulses: Normal pulses.     Heart sounds: Normal heart sounds.  Pulmonary:     Effort: Pulmonary effort is normal.     Breath sounds: Normal breath sounds.  Abdominal:     General: Abdomen is flat. Bowel sounds are normal.     Palpations: Abdomen is soft.  Musculoskeletal:        General: Normal range of motion.     Cervical back: Normal range of motion and neck supple.  Skin:    General: Skin is warm.     Capillary Refill:  Capillary refill takes less than 2 seconds.  Neurological:     General: No focal deficit present.     Mental Status: She is alert and oriented to person, place, and time.  Psychiatric:  Mood and Affect: Mood normal.        Behavior: Behavior normal.        Thought Content: Thought content normal.        Judgment: Judgment normal.     ED Results / Procedures / Treatments   Labs (all labs ordered are listed, but only abnormal results are displayed) Labs Reviewed  CBC WITH DIFFERENTIAL/PLATELET - Abnormal; Notable for the following components:      Result Value   Hemoglobin 11.3 (*)    HCT 35.1 (*)    All other components within normal limits  COMPREHENSIVE METABOLIC PANEL - Abnormal; Notable for the following components:   Sodium 134 (*)    Glucose, Bld 184 (*)    AST 14 (*)    All other components within normal limits  URINALYSIS, ROUTINE W REFLEX MICROSCOPIC - Abnormal; Notable for the following components:   APPearance CLOUDY (*)    Leukocytes,Ua LARGE (*)    WBC, UA >50 (*)    Bacteria, UA FEW (*)    All other components within normal limits  SARS CORONAVIRUS 2 BY RT PCR (HOSPITAL ORDER, PERFORMED IN [facility])  URINE CULTURE    EKG None  Radiology No results found.  Procedures Procedures (including critical care time)  Medications Ordered in ED Medications  sulfamethoxazole-trimethoprim (BACTRIM DS) 800-160 MG per tablet 1 tablet (has no administration in time range)    ED Course  I have reviewed the triage vital signs and the nursing notes.  Pertinent labs & imaging results that were available during my care of the patient were reviewed by me and considered in my medical decision making (see chart for details).    MDM Rules/Calculators/A&P                          Pt looks nontoxic.  She does have a UTI.  She does not look septic.  She is negative for Covid.  I will start her on bactrim.  She knows to return if worse.  F/u with  pcp.   Final Clinical Impression(s) / ED Diagnoses Final diagnoses:  Acute cystitis without hematuria    Rx / DC Orders ED Discharge Orders         Ordered    sulfamethoxazole-trimethoprim (BACTRIM DS) 800-160 MG tablet  2 times daily        01/07/20 2044           Jacalyn Lefevre, MD 01/07/20 2052

## 2020-01-07 NOTE — Progress Notes (Signed)
   HPI: 54 y.o. female with PMHx of T2DM presenting today for a new complaint associated to loss of her great toenail to the left hallux.  Patient states that the nail came off.  She started to notice that the nail was lifting and loosening from the underlying nail bed for the past several days.  Approximately 2 weeks ago the nail finally came off.  She continues to experience numbness and burning secondary to her peripheral neuropathy.  She states that her PCP sent her over to have the foot evaluated.  Past Medical History:  Diagnosis Date  . Diabetes mellitus   . Hypertension      Physical Exam: General: The patient is alert and oriented x3 in no acute distress.  Dermatology: Skin is warm, dry and supple bilateral lower extremities. Negative for open lesions or macerations.  Loss of left hallux nail plate noted.  Dry stable underlying nail bed.  No drainage noted.  No malodor noted.  No erythema.  Vascular: Palpable pedal pulses bilaterally. No edema or erythema noted. Capillary refill within normal limits.  Neurological: Epicritic and protective threshold diminished bilaterally.   Musculoskeletal Exam: Pain on palpation noted to the posterior tibial tendon of the right foot with medial longitudinal arch collapse noted. Range of motion within normal limits to all pedal and ankle joints bilateral. Muscle strength 5/5 in all groups bilateral.   Assessment: 1. Diabetes mellitus type 2 - uncontrolled  2. Peripheral polyneuropathy BLE 3. PTTD right 4.  Loss of toenail left hallux nail plate  Plan of Care:  1. Patient evaluated.   2.  Continue diabetic shoes  3. Continue taking Gabapentin 600 mg TID as directed by PCP.  4. Stressed importance of lowering blood glucose levels.  5. Return to clinic as needed.      Felecia Shelling, DPM [facility]  Dr. Felecia Shelling, DPM    [address] [place], [place] [address]                  Office [phone]  Fax [phone]

## 2020-01-07 NOTE — ED Triage Notes (Signed)
Pt states she is been having fever and chills since yesterday, states she is covid vaccinated.

## 2020-01-09 LAB — URINE CULTURE

## 2020-02-08 ENCOUNTER — Emergency Department (HOSPITAL_COMMUNITY)
Admission: EM | Admit: 2020-02-08 | Discharge: 2020-02-08 | Disposition: A | Discharge status: Home / Self Care | Payer: Medicaid Other | Attending: Emergency Medicine | Admitting: Emergency Medicine

## 2020-02-08 ENCOUNTER — Other Ambulatory Visit: Payer: Self-pay

## 2020-02-08 DIAGNOSIS — Z794 Long term (current) use of insulin: Secondary | ICD-10-CM | POA: Insufficient documentation

## 2020-02-08 DIAGNOSIS — E119 Type 2 diabetes mellitus without complications: Secondary | ICD-10-CM | POA: Insufficient documentation

## 2020-02-08 DIAGNOSIS — R509 Fever, unspecified: Secondary | ICD-10-CM

## 2020-02-08 DIAGNOSIS — I10 Essential (primary) hypertension: Secondary | ICD-10-CM | POA: Diagnosis not present

## 2020-02-08 DIAGNOSIS — Z8616 Personal history of COVID-19: Secondary | ICD-10-CM | POA: Diagnosis not present

## 2020-02-08 DIAGNOSIS — Z7982 Long term (current) use of aspirin: Secondary | ICD-10-CM | POA: Diagnosis not present

## 2020-02-08 DIAGNOSIS — Z20822 Contact with and (suspected) exposure to covid-19: Secondary | ICD-10-CM | POA: Diagnosis not present

## 2020-02-08 DIAGNOSIS — Z79899 Other long term (current) drug therapy: Secondary | ICD-10-CM | POA: Diagnosis not present

## 2020-02-08 LAB — URINALYSIS, ROUTINE W REFLEX MICROSCOPIC
Bilirubin Urine: NEGATIVE
Glucose, UA: 50 mg/dL — AB
Hgb urine dipstick: NEGATIVE
Ketones, ur: NEGATIVE mg/dL
Nitrite: NEGATIVE
Protein, ur: NEGATIVE mg/dL
Specific Gravity, Urine: 1.011 (ref 1.005–1.030)
pH: 6 (ref 5.0–8.0)

## 2020-02-08 LAB — RESPIRATORY PANEL BY RT PCR (FLU A&B, COVID)
Influenza A by PCR: NEGATIVE
Influenza B by PCR: NEGATIVE
SARS Coronavirus 2 by RT PCR: NEGATIVE

## 2020-02-08 MED ORDER — NITROFURANTOIN MONOHYD MACRO 100 MG PO CAPS: 100.0000 mg | ORAL_CAPSULE | Freq: Two times a day (BID) | ORAL | 0 refills | Status: DC

## 2020-02-08 NOTE — ED Provider Notes (Signed)
[facility] EMERGENCY DEPARTMENT Provider Note   CSN: [id number] Arrival date & time: 02/08/20  6734     History Chief Complaint  Patient presents with  . Covid Exposure    Karla Mack is a 54 y.o. female.  Patient presents to the emergency department for evaluation of fever in setting of high risk Covid contact.  Patient states that over the past 2 days she has had fevers, controlled with Tylenol at home.  She denies ear pain, runny nose, sore throat.  No significant cough, nausea or vomiting.  No diarrhea.  Patient with recent UTI, however no active UTI symptoms.  She has been around her son who tested positive for Covid.  She received her second Covid vaccine in April 2021.  No other treatments.        Past Medical History:  Diagnosis Date  . Diabetes mellitus   . Hypertension     Patient Active Problem List   Diagnosis Date Noted  . COVID-19 virus infection 03/13/2019  . Essential hypertension, benign 01/04/2013  . Uncontrolled type 2 diabetes mellitus (HCC) 10/17/2006  . HYPERLIPIDEMIA 10/17/2006    No past surgical history on file.   OB History    Gravida  4   Para  4   Term  4   Preterm      AB      Living  4     SAB      TAB      Ectopic      Multiple      Live Births              Family History  Problem Relation Age of Onset  . Breast cancer Neg Hx     Social History   Tobacco Use  . Smoking status: Never Smoker  . Smokeless tobacco: Never Used  Substance Use Topics  . Alcohol use: No  . Drug use: No    Home Medications Prior to Admission medications   Medication Sig Start Date End Date Taking? Authorizing Provider  ACCU-CHEK GUIDE test strip FOR TESTING 3 TIMES DAILY 11/13/18   [provider]  amLODipine (NORVASC) 10 MG tablet Take 1 tablet (10 mg total) by mouth daily. 01/04/13   Rai, Delene Ruffini, MD  aspirin 81 MG tablet Take 1 tablet (81 mg total) by mouth daily. 01/04/13    Rai, Delene Ruffini, MD  benzonatate (TESSALON) 100 MG capsule Take 1 capsule (100 mg total) by mouth every 8 (eight) hours. 03/07/19   Elpidio Anis, PA-C  canagliflozin (INVOKANA) 300 MG TABS tablet Take 300 mg by mouth daily before breakfast.    [provider]  cetirizine (ZYRTEC) 10 MG tablet Take 1 tablet (10 mg total) by mouth daily as needed for allergies. 01/04/13   Rai, Delene Ruffini, MD  diclofenac (VOLTAREN) 75 MG EC tablet TK 1 T PO BID WF PRN 11/03/18   [provider]  gabapentin (NEURONTIN) 300 MG capsule TK 1 C PO TID 11/03/18   [provider]  hydrochlorothiazide (HYDRODIURIL) 25 MG tablet Take 1 tablet (25 mg total) by mouth daily. 01/04/13   Rai, Ripudeep K, MD  insulin aspart (NOVOLOG) 100 UNIT/ML injection Inject 10-15 Units into the skin 3 (three) times daily before meals. 10 units in the morning 15 units at lunch 10 units at dinner 01/04/13   Rai, Ripudeep K, MD  Insulin Glargine (LANTUS SOLOSTAR) 100 UNIT/ML SOPN Inject 70 Units into the skin at bedtime.  01/31/13   Quentin Angst, MD  levonorgestrel (MIRENA) 20 MCG/24HR IUD 1 each by Intrauterine route once.    [provider]  lisinopril-hydrochlorothiazide (PRINZIDE,ZESTORETIC) 10-12.5 MG per tablet Take 1 tablet by mouth daily.    [provider]  magic mouthwash w/lidocaine SOLN Take 5 mLs by mouth 4 (four) times daily as needed for mouth pain. 03/07/19   Elpidio Anis, PA-C  meloxicam (MOBIC) 7.5 MG tablet Take 7.5 mg by mouth daily.    [provider]  metFORMIN (GLUCOPHAGE) 1000 MG tablet Take 1 tablet (1,000 mg total) by mouth 2 (two) times daily with a meal. 01/04/13   Rai, Ripudeep K, MD  miconazole (MICOTIN) 2 % cream Apply topically 2 (two) times daily. 09/06/12   Gwyneth Sprout, MD  norethindrone-ethinyl estradiol (TRIPHASIL,CYCLAFEM,ALYACEN) 0.5/0.75/1-35 MG-MCG tablet Take 1 tablet by mouth daily. 01/04/13   Rai, Ripudeep K, MD  ondansetron (ZOFRAN ODT) 4 MG  disintegrating tablet Take 1 tablet (4 mg total) by mouth every 8 (eight) hours as needed for nausea or vomiting. 03/07/19   Elpidio Anis, PA-C  oxyCODONE-acetaminophen (PERCOCET/ROXICET) 5-325 MG tablet Take 2 tablets by mouth every 6 (six) hours as needed for severe pain. 10/23/15   Hedges, Tinnie Gens, PA-C  pravastatin (PRAVACHOL) 40 MG tablet Take 1 tablet (40 mg total) by mouth daily. 01/04/13   Rai, Delene Ruffini, MD  predniSONE (DELTASONE) 20 MG tablet Take 2 tablets (40 mg total) by mouth daily. 01/11/16   Benjiman Core, MD  simvastatin (ZOCOR) 40 MG tablet TK 1 T PO QD 09/14/18   [provider]  tiZANidine (ZANAFLEX) 4 MG tablet TAKE 1 TABLET BY MOUTH TWICE DAILY AS NEEDED FOR NECK PAINS 11/01/18   [provider]    Allergies    Cholestatin  Review of Systems   Review of Systems  Constitutional: Positive for fever. Negative for chills.  HENT: Negative for rhinorrhea and sore throat.   Eyes: Negative for redness.  Respiratory: Negative for cough.   Cardiovascular: Negative for chest pain.  Gastrointestinal: Negative for abdominal pain, diarrhea, nausea and vomiting.  Genitourinary: Negative for dysuria, frequency, hematuria and urgency.  Musculoskeletal: Negative for myalgias.  Skin: Negative for rash.  Neurological: Negative for headaches.    Physical Exam Updated Vital Signs BP (!) 145/73   Pulse (!) 107   Temp 98.6 F (37 C) (Oral)   Resp 18   SpO2 100%   Physical Exam Vitals and nursing note reviewed.  Constitutional:      General: She is not in acute distress.    Appearance: She is well-developed.  HENT:     Head: Normocephalic and atraumatic.     Right Ear: External ear normal.     Left Ear: External ear normal.     Nose: Nose normal.  Eyes:     Conjunctiva/sclera: Conjunctivae normal.  Cardiovascular:     Rate and Rhythm: Regular rhythm. Tachycardia present.     Heart sounds: No murmur heard.      Comments: Mild tachycardia Pulmonary:      Effort: No respiratory distress.     Breath sounds: No wheezing, rhonchi or rales.     Comments: Lungs clear to auscultation bilaterally, no respiratory distress or accessory muscle use. Abdominal:     Palpations: Abdomen is soft.     Tenderness: There is no abdominal tenderness. There is no guarding or rebound.  Musculoskeletal:     Cervical back: Normal range of motion and neck supple.     Right  lower leg: No edema.     Left lower leg: No edema.  Skin:    General: Skin is warm and dry.     Findings: No rash.  Neurological:     General: No focal deficit present.     Mental Status: She is alert. Mental status is at baseline.     Motor: No weakness.  Psychiatric:        Mood and Affect: Mood normal.     ED Results / Procedures / Treatments   Labs (all labs ordered are listed, but only abnormal results are displayed) Labs Reviewed  URINALYSIS, ROUTINE W REFLEX MICROSCOPIC - Abnormal; Notable for the following components:      Result Value   APPearance HAZY (*)    Glucose, UA 50 (*)    Leukocytes,Ua LARGE (*)    Bacteria, UA MANY (*)    All other components within normal limits  RESPIRATORY PANEL BY RT PCR (FLU A&B, COVID)  URINE CULTURE    EKG None  Radiology No results found.  Procedures Procedures (including critical care time)  Medications Ordered in ED Medications - No data to display  ED Course  I have reviewed the triage vital signs and the nursing notes.  Pertinent labs & imaging results that were available during my care of the patient were reviewed by me and considered in my medical decision making (see chart for details).  Patient seen and examined.  Covid testing pending.  Patient is concerned in regards to recent UTI and would like her urine checked.  UA ordered.  If Covid positive, patient would likely be a candidate for monoclonal antibody therapy.  Vital signs reviewed and are as follows: BP (!) 145/73   Pulse (!) 107   Temp 98.6 F (37 C)  (Oral)   Resp 18   SpO2 100%   12:45 PM Covid negative.  UA equivocal.  Culture sent.  Given reported fevers and history of diabetes, will discharge patient to home on Macrobid.  UTI month ago was treated with Bactrim.  Culture grew multiple species.  Patient encouraged to follow-up with her primary care doctor.  Encouraged return to the emergency department with abdominal pain, back pain, vomiting.  She verbalizes understanding agrees with plan.    MDM Rules/Calculators/A&P                          Patient presents with fever in setting of high risk Covid exposure.  Covid testing is negative.  Patient reports recent UTI but no active symptoms.  She does have some white blood cells in the urine.  Culture pending.  Do not suspect pyelonephritis.  Abdomen is soft and nontender.  Patient appears well, nontoxic.  Will discharged home with close PCP follow-up.  Final Clinical Impression(s) / ED Diagnoses Final diagnoses:  Fever, unspecified fever cause    Rx / DC Orders ED Discharge Orders         Ordered    nitrofurantoin, macrocrystal-monohydrate, (MACROBID) 100 MG capsule  2 times daily        02/08/20 1242           Renne Crigler, PA-C 02/08/20 1247    Terald Sleeper, MD 02/08/20 1901

## 2020-02-08 NOTE — ED Triage Notes (Signed)
Pt here pov with reports of fever X2 days. Pt states her son recently dx with covid. Denies CP, shob.

## 2020-02-08 NOTE — ED Notes (Signed)
Pt encouraged to provide urine specimen per MD order. Bedside commode to bedside

## 2020-02-08 NOTE — ED Notes (Signed)
Pt d/c home per MD order. Discharge summary reviewed with pt, pt verbalizes understanding. Ambulatory off unit. No s/s of acute distress noted.  

## 2020-02-08 NOTE — Discharge Instructions (Signed)
Please read and follow all provided instructions.  Your diagnoses today include:  1. Fever, unspecified fever cause     Tests performed today include:  Covid and flu test - negative for Covid and flu  Urine test - a few infection fighting cells  Urine culture - pending  Vital signs. See below for your results today.   Medications prescribed:   Macrobid - antibiotic for urinary tract infection  You have been prescribed an antibiotic medicine: take the entire course of medicine even if you are feeling better. Stopping early can cause the antibiotic not to work.  Take any prescribed medications only as directed.  Home care instructions:  Follow any educational materials contained in this packet.  BE VERY CAREFUL not to take multiple medicines containing Tylenol (also called acetaminophen). Doing so can lead to an overdose which can damage your liver and cause liver failure and possibly death.   Follow-up instructions: Please follow-up with your primary care provider in the next 3 days for further evaluation of your symptoms.   Return instructions:   Please return to the Emergency Department if you experience worsening symptoms.   Please return if you have any other emergent concerns.  Additional Information:  Your vital signs today were: BP (!) 145/73    Pulse (!) 107    Temp 98.6 F (37 C) (Oral)    Resp 18    SpO2 100%  If your blood pressure (BP) was elevated above 135/85 this visit, please have this repeated by your doctor within one month. --------------

## 2020-02-10 LAB — URINE CULTURE: Culture: <10000 — AB

## 2020-07-17 ENCOUNTER — Emergency Department (HOSPITAL_COMMUNITY)
Admission: EM | Admit: 2020-07-17 | Discharge: 2020-07-17 | Disposition: A | Discharge status: Home / Self Care | Payer: Medicaid Other | Attending: Emergency Medicine | Admitting: Emergency Medicine

## 2020-07-17 ENCOUNTER — Emergency Department (HOSPITAL_COMMUNITY): Payer: Medicaid Other

## 2020-07-17 DIAGNOSIS — Z79899 Other long term (current) drug therapy: Secondary | ICD-10-CM | POA: Insufficient documentation

## 2020-07-17 DIAGNOSIS — Z8616 Personal history of COVID-19: Secondary | ICD-10-CM | POA: Diagnosis not present

## 2020-07-17 DIAGNOSIS — Z794 Long term (current) use of insulin: Secondary | ICD-10-CM | POA: Insufficient documentation

## 2020-07-17 DIAGNOSIS — R200 Anesthesia of skin: Secondary | ICD-10-CM | POA: Diagnosis present

## 2020-07-17 DIAGNOSIS — I10 Essential (primary) hypertension: Secondary | ICD-10-CM | POA: Diagnosis not present

## 2020-07-17 DIAGNOSIS — Z7982 Long term (current) use of aspirin: Secondary | ICD-10-CM | POA: Diagnosis not present

## 2020-07-17 DIAGNOSIS — R269 Unspecified abnormalities of gait and mobility: Secondary | ICD-10-CM | POA: Insufficient documentation

## 2020-07-17 DIAGNOSIS — Z7984 Long term (current) use of oral hypoglycemic drugs: Secondary | ICD-10-CM | POA: Insufficient documentation

## 2020-07-17 DIAGNOSIS — R202 Paresthesia of skin: Secondary | ICD-10-CM | POA: Diagnosis not present

## 2020-07-17 DIAGNOSIS — M79601 Pain in right arm: Secondary | ICD-10-CM | POA: Diagnosis not present

## 2020-07-17 DIAGNOSIS — E119 Type 2 diabetes mellitus without complications: Secondary | ICD-10-CM | POA: Diagnosis not present

## 2020-07-17 DIAGNOSIS — R531 Weakness: Secondary | ICD-10-CM | POA: Diagnosis not present

## 2020-07-17 MED ORDER — LORAZEPAM 2 MG/ML IJ SOLN: 1.0000 mg | Freq: Once | INTRAMUSCULAR | Status: DC | PRN

## 2020-07-17 NOTE — Discharge Instructions (Addendum)
Get help right away if you: Feel muscle weakness. Develop new weakness in an arm or leg. Have trouble walking or moving. Have problems with speech, understanding, or vision. Feel confused. Cannot control your bladder or bowel movements. 

## 2020-07-17 NOTE — ED Provider Notes (Signed)
[facility]ERGENC[facility] Provider Note   CSN: [id number] Arrival date & time: 07/17/20  1335     History Chief Complaint  Patient presents with  . Neck Pain  . Numbness    Karla Mack is a 55 y.o. female with a pmh of DM and Chronic RU/LE weakness who presents with R arm pain and numbness.  There is a language barrier and patient declines using the translation service.  Her husband helps translate some of the information however patient does understand a good portion of [place].  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  She has had longstanding numbness in her feet and is a diabetic.  Over the last 2 weeks she developed severe pain which she rates as 8 out of 10 radiating down the right arm with associated paresthesia.  Her husband also notes that she has fallen twice because her legs seem to buckle and give out from underneath her but he also has noticed and the patient agrees that her balance is much worse than normal.  Patient denies any facial numbness as opposed to the intake night note from triage nurse.  She denies changes in vision, difficulty with speech or swallowing, severe headache.  She denies any head injuries.  HPI     Past Medical History:  Diagnosis Date  . Diabetes mellitus   . Hypertension     Patient Active Problem List   Diagnosis Date Noted  . COVID-19 virus infection 03/13/2019  . Essential hypertension, benign 01/04/2013  . Uncontrolled type 2 diabetes mellitus (HCC) 10/17/2006  . HYPERLIPIDEMIA 10/17/2006    No past surgical history on file.   OB History    Gravida  4   Para  4   Term  4   Preterm      AB      Living  4     SAB      IAB      Ectopic      Multiple      Live Births              Family History  Problem Relation Age of Onset  . Breast cancer Neg Hx     Social History   Tobacco Use  . Smoking status: Never Smoker  . Smokeless tobacco:  Never Used  Substance Use Topics  . Alcohol use: No  . Drug use: No    Home Medications Prior to Admission medications   Medication Sig Start Date End Date Taking? Authorizing Provider  ACCU-CHEK GUIDE test strip FOR TESTING 3 TIMES DAILY 11/13/18   [provider]  amLODipine (NORVASC) 10 MG tablet Take 1 tablet (10 mg total) by mouth daily. 01/04/13   Rai, Delene Ruffiniipudeep K, MD  aspirin 81 MG tablet Take 1 tablet (81 mg total) by mouth daily. 01/04/13   Rai, Delene Ruffiniipudeep K, MD  benzonatate (TESSALON) 100 MG capsule Take 1 capsule (100 mg total) by mouth every 8 (eight) hours. 03/07/19   Elpidio AnisUpstill, Shari, PA-C  canagliflozin (INVOKANA) 300 MG TABS tablet Take 300 mg by mouth daily before breakfast.    [provider]  cetirizine (ZYRTEC) 10 MG tablet Take 1 tablet (10 mg total) by mouth daily as needed for allergies. 01/04/13   Rai, Delene Ruffiniipudeep K, MD  diclofenac (VOLTAREN) 75 MG EC tablet TK 1 T PO BID WF PRN 11/03/18   [provider]  gabapentin (NEURONTIN) 300 MG capsule TK 1 C PO  TID 11/03/18   [provider]  hydrochlorothiazide (HYDRODIURIL) 25 MG tablet Take 1 tablet (25 mg total) by mouth daily. 01/04/13   Rai, Ripudeep K, MD  insulin aspart (NOVOLOG) 100 UNIT/ML injection Inject 10-15 Units into the skin 3 (three) times daily before meals. 10 units in the morning 15 units at lunch 10 units at dinner 01/04/13   Rai, Ripudeep K, MD  Insulin Glargine (LANTUS SOLOSTAR) 100 UNIT/ML SOPN Inject 70 Units into the skin at bedtime. 01/31/13   Quentin Angst, MD  levonorgestrel (MIRENA) 20 MCG/24HR IUD 1 each by Intrauterine route once.    [provider]  lisinopril-hydrochlorothiazide (PRINZIDE,ZESTORETIC) 10-12.5 MG per tablet Take 1 tablet by mouth daily.    [provider]  magic mouthwash w/lidocaine SOLN Take 5 mLs by mouth 4 (four) times daily as needed for mouth pain. 03/07/19   Elpidio Anis, PA-C  meloxicam (MOBIC) 7.5 MG tablet Take 7.5 mg by  mouth daily.    [provider]  metFORMIN (GLUCOPHAGE) 1000 MG tablet Take 1 tablet (1,000 mg total) by mouth 2 (two) times daily with a meal. 01/04/13   Rai, Ripudeep K, MD  miconazole (MICOTIN) 2 % cream Apply topically 2 (two) times daily. 09/06/12   Gwyneth Sprout, MD  nitrofurantoin, macrocrystal-monohydrate, (MACROBID) 100 MG capsule Take 1 capsule (100 mg total) by mouth 2 (two) times daily. 02/08/20   Renne Crigler, PA-C  norethindrone-ethinyl estradiol (TRIPHASIL,CYCLAFEM,ALYACEN) 0.5/0.75/1-35 MG-MCG tablet Take 1 tablet by mouth daily. 01/04/13   Rai, Ripudeep K, MD  ondansetron (ZOFRAN ODT) 4 MG disintegrating tablet Take 1 tablet (4 mg total) by mouth every 8 (eight) hours as needed for nausea or vomiting. 03/07/19   Elpidio Anis, PA-C  oxyCODONE-acetaminophen (PERCOCET/ROXICET) 5-325 MG tablet Take 2 tablets by mouth every 6 (six) hours as needed for severe pain. 10/23/15   Hedges, Tinnie Gens, PA-C  pravastatin (PRAVACHOL) 40 MG tablet Take 1 tablet (40 mg total) by mouth daily. 01/04/13   Rai, Delene Ruffini, MD  predniSONE (DELTASONE) 20 MG tablet Take 2 tablets (40 mg total) by mouth daily. 01/11/16   Benjiman Core, MD  simvastatin (ZOCOR) 40 MG tablet TK 1 T PO QD 09/14/18   [provider]  tiZANidine (ZANAFLEX) 4 MG tablet TAKE 1 TABLET BY MOUTH TWICE DAILY AS NEEDED FOR NECK PAINS 11/01/18   [provider]    Allergies    Cholestatin  Review of Systems   Review of Systems Ten systems reviewed and are negative for acute change, except as noted in the HPI.   Physical Exam Updated Vital Signs BP 112/90   Pulse (!) 102   Temp 98.8 F (37.1 C) (Oral)   Resp 15   SpO2 100%   Physical Exam Vitals and nursing note reviewed.  Constitutional:      General: She is not in acute distress.    Appearance: She is well-developed. She is not diaphoretic.  HENT:     Head: Normocephalic and atraumatic.  Eyes:     General: No scleral icterus.     Conjunctiva/sclera: Conjunctivae normal.  Cardiovascular:     Rate and Rhythm: Normal rate and regular rhythm.     Heart sounds: Normal heart sounds. No murmur heard. No friction rub. No gallop.   Pulmonary:     Effort: Pulmonary effort is normal. No respiratory distress.     Breath sounds: Normal breath sounds.  Abdominal:     General: Bowel sounds are normal. There is no distension.  Palpations: Abdomen is soft. There is no mass.     Tenderness: There is no abdominal tenderness. There is no guarding.  Musculoskeletal:     Cervical back: Normal range of motion.  Skin:    General: Skin is warm and dry.  Neurological:     Mental Status: She is alert and oriented to person, place, and time.     Cranial Nerves: No cranial nerve deficit.     Sensory: Sensory deficit present.     Motor: Weakness present.     Coordination: Coordination normal.     Gait: Gait abnormal.     Deep Tendon Reflexes: Reflexes normal.     Comments: Weakness in the right upper and lower extremities.  Abnormal sensation to touch in the right arm as compared to the left. Abnormal gait due to weakness on the right side which is patient's baseline.  Unable to assess balance  Psychiatric:        Behavior: Behavior normal.     ED Results / Procedures / Treatments   Labs (all labs ordered are listed, but only abnormal results are displayed) Labs Reviewed - No data to display  EKG EKG Interpretation  Date/Time:  Thursday July 17 2020 13:56:21 EDT Ventricular Rate:  117 PR Interval:  130 QRS Duration: 76 QT Interval:  320 QTC Calculation: 446 R Axis:   56 Text Interpretation: Sinus tachycardia with Premature atrial complexes Otherwise normal ECG No significant change since last tracing Confirmed by Alvira Monday ([id number]) on 07/17/2020 3:35:09 PM   Radiology MR BRAIN WO CONTRAST  Result Date: 07/17/2020 CLINICAL DATA:  Ataxia EXAM: MRI HEAD WITHOUT CONTRAST TECHNIQUE: Multiplanar, multiecho pulse  sequences of the brain and surrounding structures were obtained without intravenous contrast. COMPARISON:  2013 FINDINGS: Brain: There is no acute infarction or intracranial hemorrhage. There is no intracranial mass, mass effect, or edema. There is no hydrocephalus or extra-axial fluid collection. Ventricles and sulci are within normal limits in size and configuration. Patchy foci of T2 hyperintensity in the supratentorial white matter are nonspecific but may reflect mild chronic microvascular ischemic changes. There are chronic small vessel infarcts of the left caudate body and left corona radiata. Vascular: Major vessel flow voids at the skull base are preserved. Skull and upper cervical spine: Normal marrow signal is preserved. Sinuses/Orbits: Paranasal sinuses are aerated. Orbits are unremarkable. Other: Sella is unremarkable.  Mastoid air cells are clear. IMPRESSION: No evidence of recent infarction, hemorrhage, or mass. 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 Electronically Signed   By: Guadlupe Spanish M.D.   On: 07/17/2020 17:34   MR Cervical Spine Wo Contrast  Result Date: 07/17/2020 CLINICAL DATA:  Neck pain radiating to right shoulder, numbness in bilateral upper extremities EXAM: MRI CERVICAL SPINE WITHOUT CONTRAST TECHNIQUE: Multiplanar, multisequence MR imaging of the cervical spine was performed. No intravenous contrast was administered. COMPARISON:  None. FINDINGS: Motion artifact is present. Alignment: Preserved. Vertebrae: Vertebral body heights are maintained. There is no substantial marrow edema. No suspicious osseous lesion. Cord: No definite abnormal cord signal within the above limitation. Posterior Fossa, vertebral arteries, paraspinal tissues: Unremarkable. Disc levels: Trace disc bulges are present from C3-C4 through C5-C6 with small endplate osteophytes. There is no canal or foraminal stenosis at any level. IMPRESSION: Motion degraded. No definite  abnormal cord signal. Minor degenerative changes without stenosis. Electronically Signed   By: Guadlupe Spanish M.D.   On: 07/17/2020 17:51    Procedures Procedures   Medications Ordered in ED Medications  LORazepam (ATIVAN) injection 1 mg (has no administration in time range)    ED Course  I have reviewed the triage vital signs and the nursing notes.  Pertinent labs & imaging results that were available during my care of the patient were reviewed by me and considered in my medical decision making (see chart for details).    MDM Rules/Calculators/A&P                          Here with weakness and paresthesia of the right upper extremity.  She is also having some balance issues.  She has a history of diabetes.  Differential includes stroke, compressive myelopathy, radiculopathy, peripheral neuropathy.  I ordered and reviewed images that included an MRI of the brain and C-spine.  There are no acute findings of either infarct, myelopathy or stenosis causing symptoms the patient has today.  I ordered and reviewed an EKG. which shows sinus tachycardia at a rate of 117 which seems to have improved throughout her visit.  Patient otherwise feels well and looks appropriate for discharge at this time with close outpatient follow-up.  She is given an ambulatory referral to neurology.  Final Clinical Impression(s) / ED Diagnoses Final diagnoses:  Paresthesia    Rx / DC Orders ED Discharge Orders         Ordered    Ambulatory referral to Neurology       Comments: An appointment is requested in approximately: 4 weeks   07/17/20 1853           Arthor Captain, PA-C 07/17/20 2253    Alvira Monday, MD 07/18/20 [phone]

## 2020-07-17 NOTE — ED Triage Notes (Signed)
Pt arrives via POV from doctors office where she was told to come to ED for further evaluation of neck pain that radiates to right shoulder, numbness in bilateral upper extremities as well as facial numbness. No droop, drift, slurred speech.

## 2020-09-01 ENCOUNTER — Other Ambulatory Visit: Payer: Self-pay | Admitting: Orthopedic Surgery

## 2020-09-01 DIAGNOSIS — M79604 Pain in right leg: Secondary | ICD-10-CM

## 2020-09-02 ENCOUNTER — Encounter: Payer: Self-pay | Admitting: Neurology

## 2020-09-02 ENCOUNTER — Telehealth: Payer: Self-pay | Admitting: *Deleted

## 2020-09-02 ENCOUNTER — Ambulatory Visit: Payer: Medicaid Other | Admitting: Neurology

## 2020-09-02 NOTE — Telephone Encounter (Signed)
No showed new patient appointment. 

## 2020-11-03 ENCOUNTER — Other Ambulatory Visit: Payer: Self-pay | Admitting: Internal Medicine

## 2020-11-07 LAB — CBC
HCT: 33.1 % — ABNORMAL LOW (ref 35.0–45.0)
Hemoglobin: 10.9 g/dL — ABNORMAL LOW (ref 11.7–15.5)
MCH: 27.6 pg (ref 27.0–33.0)
MCHC: 32.9 g/dL (ref 32.0–36.0)
MCV: 83.8 fL (ref 80.0–100.0)
MPV: 9 fL (ref 7.5–12.5)
Platelets: 351 10*3/uL (ref 140–400)
RBC: 3.95 10*6/uL (ref 3.80–5.10)
RDW: 12.6 % (ref 11.0–15.0)
WBC: 6.5 10*3/uL (ref 3.8–10.8)

## 2020-11-07 LAB — IRON, TOTAL/TOTAL IRON BINDING CAP
%SAT: 19 % (calc) (ref 16–45)
Iron: 80 ug/dL (ref 45–160)
TIBC: 411 mcg/dL (calc) (ref 250–450)

## 2020-11-07 LAB — TSH: TSH: 1.94 mIU/L (ref 0.40–4.50)

## 2020-11-07 LAB — COMPLETE METABOLIC PANEL WITH GFR
AG Ratio: 1.5 (calc) (ref 1.0–2.5)
ALT: 11 U/L (ref 6–29)
AST: 14 U/L (ref 10–35)
Albumin: 4.5 g/dL (ref 3.6–5.1)
Alkaline phosphatase (APISO): 84 U/L (ref 37–153)
BUN/Creatinine Ratio: 38 (calc) — ABNORMAL HIGH (ref 6–22)
BUN: 29 mg/dL — ABNORMAL HIGH (ref 7–25)
CO2: 22 mmol/L (ref 20–32)
Calcium: 10.2 mg/dL (ref 8.6–10.4)
Chloride: 101 mmol/L (ref 98–110)
Creat: 0.76 mg/dL (ref 0.50–1.03)
Globulin: 3.1 g/dL (calc) (ref 1.9–3.7)
Glucose, Bld: 159 mg/dL — ABNORMAL HIGH (ref 65–99)
Potassium: 4.8 mmol/L (ref 3.5–5.3)
Sodium: 135 mmol/L (ref 135–146)
Total Bilirubin: 0.3 mg/dL (ref 0.2–1.2)
Total Protein: 7.6 g/dL (ref 6.1–8.1)
eGFR: 92 mL/min/{1.73_m2} (ref >=60)

## 2020-11-07 LAB — B12 AND FOLATE PANEL
Folate: >24 ng/mL
Vitamin B-12: 304 pg/mL (ref 200–1100)

## 2020-11-07 LAB — LIPID PANEL
Cholesterol: 210 mg/dL — ABNORMAL HIGH (ref <200)
HDL: 55 mg/dL (ref >=50)
LDL Cholesterol (Calc): 119 mg/dL (calc) — ABNORMAL HIGH
Non-HDL Cholesterol (Calc): 155 mg/dL (calc) — ABNORMAL HIGH (ref <130)
Total CHOL/HDL Ratio: 3.8 (calc) (ref <5.0)
Triglycerides: 252 mg/dL — ABNORMAL HIGH (ref <150)

## 2020-11-07 LAB — FERRITIN: Ferritin: 17 ng/mL (ref 16–232)

## 2020-11-07 LAB — VITAMIN D 25 HYDROXY (VIT D DEFICIENCY, FRACTURES): Vit D, 25-Hydroxy: 22 ng/mL — ABNORMAL LOW (ref 30–100)

## 2020-11-25 ENCOUNTER — Other Ambulatory Visit: Payer: Self-pay | Admitting: Internal Medicine

## 2020-11-25 DIAGNOSIS — Z1231 Encounter for screening mammogram for malignant neoplasm of breast: Secondary | ICD-10-CM

## 2020-12-04 ENCOUNTER — Ambulatory Visit
Admission: RE | Admit: 2020-12-04 | Discharge: 2020-12-04 | Disposition: A | Discharge status: Home / Self Care | Payer: Medicaid Other | Source: Ambulatory Visit | Attending: Internal Medicine | Admitting: Internal Medicine

## 2020-12-04 ENCOUNTER — Other Ambulatory Visit: Payer: Self-pay

## 2020-12-04 DIAGNOSIS — Z1231 Encounter for screening mammogram for malignant neoplasm of breast: Secondary | ICD-10-CM

## 2021-01-03 ENCOUNTER — Emergency Department (HOSPITAL_COMMUNITY): Payer: Medicaid Other

## 2021-01-03 ENCOUNTER — Encounter (HOSPITAL_COMMUNITY): Payer: Self-pay

## 2021-01-03 ENCOUNTER — Emergency Department (HOSPITAL_COMMUNITY)
Admission: EM | Admit: 2021-01-03 | Discharge: 2021-01-03 | Disposition: A | Discharge status: Home / Self Care | Payer: Medicaid Other | Attending: Emergency Medicine | Admitting: Emergency Medicine

## 2021-01-03 ENCOUNTER — Other Ambulatory Visit: Payer: Self-pay

## 2021-01-03 DIAGNOSIS — Z794 Long term (current) use of insulin: Secondary | ICD-10-CM | POA: Diagnosis not present

## 2021-01-03 DIAGNOSIS — E119 Type 2 diabetes mellitus without complications: Secondary | ICD-10-CM | POA: Insufficient documentation

## 2021-01-03 DIAGNOSIS — Z7984 Long term (current) use of oral hypoglycemic drugs: Secondary | ICD-10-CM | POA: Diagnosis not present

## 2021-01-03 DIAGNOSIS — R197 Diarrhea, unspecified: Secondary | ICD-10-CM | POA: Diagnosis not present

## 2021-01-03 DIAGNOSIS — I10 Essential (primary) hypertension: Secondary | ICD-10-CM | POA: Insufficient documentation

## 2021-01-03 DIAGNOSIS — Z7982 Long term (current) use of aspirin: Secondary | ICD-10-CM | POA: Insufficient documentation

## 2021-01-03 DIAGNOSIS — R55 Syncope and collapse: Secondary | ICD-10-CM | POA: Diagnosis present

## 2021-01-03 DIAGNOSIS — Z8616 Personal history of COVID-19: Secondary | ICD-10-CM | POA: Diagnosis not present

## 2021-01-03 DIAGNOSIS — Z79899 Other long term (current) drug therapy: Secondary | ICD-10-CM | POA: Insufficient documentation

## 2021-01-03 DIAGNOSIS — R509 Fever, unspecified: Secondary | ICD-10-CM | POA: Insufficient documentation

## 2021-01-03 LAB — URINALYSIS, ROUTINE W REFLEX MICROSCOPIC
Bilirubin Urine: NEGATIVE
Glucose, UA: NEGATIVE mg/dL
Ketones, ur: NEGATIVE mg/dL
Nitrite: NEGATIVE
Protein, ur: NEGATIVE mg/dL
Specific Gravity, Urine: 1.011 (ref 1.005–1.030)
pH: 5 (ref 5.0–8.0)

## 2021-01-03 LAB — COMPREHENSIVE METABOLIC PANEL
ALT: 16 U/L (ref 0–44)
AST: 17 U/L (ref 15–41)
Albumin: 3.7 g/dL (ref 3.5–5.0)
Alkaline Phosphatase: 96 U/L (ref 38–126)
Anion gap: 10 (ref 5–15)
BUN: 29 mg/dL — ABNORMAL HIGH (ref 6–20)
CO2: 23 mmol/L (ref 22–32)
Calcium: 9.3 mg/dL (ref 8.9–10.3)
Chloride: 100 mmol/L (ref 98–111)
Creatinine, Ser: 0.88 mg/dL (ref 0.44–1.00)
GFR, Estimated: >60 mL/min (ref >60)
Glucose, Bld: 222 mg/dL — ABNORMAL HIGH (ref 70–99)
Potassium: 3.6 mmol/L (ref 3.5–5.1)
Sodium: 133 mmol/L — ABNORMAL LOW (ref 135–145)
Total Bilirubin: 0.4 mg/dL (ref 0.3–1.2)
Total Protein: 8.5 g/dL — ABNORMAL HIGH (ref 6.5–8.1)

## 2021-01-03 LAB — CBC WITH DIFFERENTIAL/PLATELET
Abs Immature Granulocytes: 0.02 10*3/uL (ref 0.00–0.07)
Basophils Absolute: 0 10*3/uL (ref 0.0–0.1)
Basophils Relative: 0 %
Eosinophils Absolute: 0.1 10*3/uL (ref 0.0–0.5)
Eosinophils Relative: 2 %
HCT: 34.5 % — ABNORMAL LOW (ref 36.0–46.0)
Hemoglobin: 11.2 g/dL — ABNORMAL LOW (ref 12.0–15.0)
Immature Granulocytes: 0 %
Lymphocytes Relative: 40 %
Lymphs Abs: 2.1 10*3/uL (ref 0.7–4.0)
MCH: 27.8 pg (ref 26.0–34.0)
MCHC: 32.5 g/dL (ref 30.0–36.0)
MCV: 85.6 fL (ref 80.0–100.0)
Monocytes Absolute: 0.5 10*3/uL (ref 0.1–1.0)
Monocytes Relative: 9 %
Neutro Abs: 2.5 10*3/uL (ref 1.7–7.7)
Neutrophils Relative %: 49 %
Platelets: 290 10*3/uL (ref 150–400)
RBC: 4.03 MIL/uL (ref 3.87–5.11)
RDW: 12.6 % (ref 11.5–15.5)
WBC: 5.2 10*3/uL (ref 4.0–10.5)
nRBC: 0 % (ref 0.0–0.2)

## 2021-01-03 LAB — CBG MONITORING, ED: Glucose-Capillary: 202 mg/dL — ABNORMAL HIGH (ref 70–99)

## 2021-01-03 MED ORDER — LACTATED RINGERS IV BOLUS
1000.0000 mL | Freq: Once | INTRAVENOUS | Status: AC
  Administered 2021-01-03: 1000 mL via INTRAVENOUS

## 2021-01-03 NOTE — ED Provider Notes (Signed)
I provided a substantive portion of the care of this patient.  I personally performed the entirety of the medical decision making for this encounter.  EKG Interpretation  Date/Time:  Saturday January 03 2021 08:22:03 EDT Ventricular Rate:  93 PR Interval:  149 QRS Duration: 53 QT Interval:  402 QTC Calculation: 500 R Axis:     Text Interpretation: [facility] Derived Leads Confirmed by Lorre Nick ([id number]) on 01/03/2021 9:25:67 AM  55 year old female presents after experiencing syncope.  Has recent diagnosis of COVID and has been dehydrated.  Plan is to IV hydrate here.  And likely discharge   Lorre Nick, MD 01/03/21 [phone]

## 2021-01-03 NOTE — Discharge Instructions (Addendum)
You were seen evaluated in the emergency department today for further evaluation of a syncope episode.  As we discussed, your lab work was reassuring for any systemic infection.  The cause is likely dehydration secondary to your diarrhea and lack of fluid intake. Please drink plenty of fluids in the coming days.   Please return to the emergency department if you experience more syncope episodes, new chest pain, new shortness of breath, weakness/numbness in an extremity, or any other concerns.   Please follow up with your primary care provider within the next week for further evaluation.

## 2021-01-03 NOTE — ED Provider Notes (Addendum)
[facility] Note   CSN: [id number] Arrival date & time: 01/03/21  0758     History Chief Complaint  Patient presents with   Loss of Consciousness    Karla Mack is a 55 y.o. female with history of hypertension, hyperlipidemia, and diabetes who presents to the emergency department today after a syncopal episode that occurred earlier this morning.  The daughter states that the patient has had COVID for the last 3 days and has not eaten much.  Sugars have been running around 216.  Patient reports associated palpitations prior to the episode.  No postictal symptoms.  Patient reports associated diarrhea, subjective fevers, and chills.  She denies any chest pain, shortness of breath, abdominal pain, nausea, vomiting, leg pain, leg swelling, or focal weakness/numbness.  Palpitations improved prior to arrival.  The history is provided by the patient and a relative. No language interpreter was used (Patient requested family member).  Loss of Consciousness Associated symptoms: no chest pain, no shortness of breath and no weakness       Past Medical History:  Diagnosis Date   Diabetes mellitus    Hypertension     Patient Active Problem List   Diagnosis Date Noted   COVID-19 virus infection 03/13/2019   Essential hypertension, benign 01/04/2013   Uncontrolled type 2 diabetes mellitus (HCC) 10/17/2006   HYPERLIPIDEMIA 10/17/2006    History reviewed. No pertinent surgical history.   OB History     Gravida  4   Para  4   Term  4   Preterm      AB      Living  4      SAB      IAB      Ectopic      Multiple      Live Births              Family History  Problem Relation Age of Onset   Breast cancer Neg Hx     Social History   Tobacco Use   Smoking status: Never   Smokeless tobacco: Never  Substance Use Topics   Alcohol use: No   Drug use: No    Home Medications Prior to Admission  medications   Medication Sig Start Date End Date Taking? Authorizing Provider  ACCU-CHEK GUIDE test strip FOR TESTING 3 TIMES DAILY 11/13/18   [provider]  amLODipine (NORVASC) 10 MG tablet Take 1 tablet (10 mg total) by mouth daily. 01/04/13   Rai, Delene Ruffini, MD  aspirin 81 MG tablet Take 1 tablet (81 mg total) by mouth daily. 01/04/13   Rai, Delene Ruffini, MD  benzonatate (TESSALON) 100 MG capsule Take 1 capsule (100 mg total) by mouth every 8 (eight) hours. 03/07/19   Elpidio Anis, PA-C  canagliflozin (INVOKANA) 300 MG TABS tablet Take 300 mg by mouth daily before breakfast.    [provider]  cetirizine (ZYRTEC) 10 MG tablet Take 1 tablet (10 mg total) by mouth daily as needed for allergies. 01/04/13   Rai, Delene Ruffini, MD  diclofenac (VOLTAREN) 75 MG EC tablet TK 1 T PO BID WF PRN 11/03/18   [provider]  gabapentin (NEURONTIN) 300 MG capsule TK 1 C PO TID 11/03/18   [provider]  hydrochlorothiazide (HYDRODIURIL) 25 MG tablet Take 1 tablet (25 mg total) by mouth daily. 01/04/13   Rai, Ripudeep K, MD  insulin aspart (NOVOLOG) 100 UNIT/ML injection Inject 10-15 Units into the skin  3 (three) times daily before meals. 10 units in the morning 15 units at lunch 10 units at dinner 01/04/13   Rai, Ripudeep K, MD  Insulin Glargine (LANTUS SOLOSTAR) 100 UNIT/ML SOPN Inject 70 Units into the skin at bedtime. 01/31/13   Quentin Angst, MD  levonorgestrel (MIRENA) 20 MCG/24HR IUD 1 each by Intrauterine route once.    [provider]  lisinopril-hydrochlorothiazide (PRINZIDE,ZESTORETIC) 10-12.5 MG per tablet Take 1 tablet by mouth daily.    [provider]  magic mouthwash w/lidocaine SOLN Take 5 mLs by mouth 4 (four) times daily as needed for mouth pain. 03/07/19   Elpidio Anis, PA-C  meloxicam (MOBIC) 7.5 MG tablet Take 7.5 mg by mouth daily.    [provider]  metFORMIN (GLUCOPHAGE) 1000 MG tablet Take 1 tablet (1,000 mg total) by  mouth 2 (two) times daily with a meal. 01/04/13   Rai, Ripudeep K, MD  miconazole (MICOTIN) 2 % cream Apply topically 2 (two) times daily. 09/06/12   Gwyneth Sprout, MD  nitrofurantoin, macrocrystal-monohydrate, (MACROBID) 100 MG capsule Take 1 capsule (100 mg total) by mouth 2 (two) times daily. 02/08/20   Renne Crigler, PA-C  norethindrone-ethinyl estradiol (TRIPHASIL,CYCLAFEM,ALYACEN) 0.5/0.75/1-35 MG-MCG tablet Take 1 tablet by mouth daily. 01/04/13   Rai, Ripudeep K, MD  ondansetron (ZOFRAN ODT) 4 MG disintegrating tablet Take 1 tablet (4 mg total) by mouth every 8 (eight) hours as needed for nausea or vomiting. 03/07/19   Elpidio Anis, PA-C  oxyCODONE-acetaminophen (PERCOCET/ROXICET) 5-325 MG tablet Take 2 tablets by mouth every 6 (six) hours as needed for severe pain. 10/23/15   Hedges, Tinnie Gens, PA-C  pravastatin (PRAVACHOL) 40 MG tablet Take 1 tablet (40 mg total) by mouth daily. 01/04/13   Rai, Delene Ruffini, MD  predniSONE (DELTASONE) 20 MG tablet Take 2 tablets (40 mg total) by mouth daily. 01/11/16   Benjiman Core, MD  simvastatin (ZOCOR) 40 MG tablet TK 1 T PO QD 09/14/18   [provider]  tiZANidine (ZANAFLEX) 4 MG tablet TAKE 1 TABLET BY MOUTH TWICE DAILY AS NEEDED FOR NECK PAINS 11/01/18   [provider]    Allergies    Cholestatin  Review of Systems   Review of Systems  Respiratory:  Negative for shortness of breath.   Cardiovascular:  Positive for syncope. Negative for chest pain.  Neurological:  Negative for weakness and numbness.  All other systems reviewed and are negative.  Physical Exam Updated Vital Signs BP (!) 141/73   Pulse 96   Temp (!) 97.5 F (36.4 C) (Oral)   Resp 17   SpO2 98%   Physical Exam Constitutional:      General: She is not in acute distress.    Appearance: Normal appearance.  HENT:     Head: Normocephalic and atraumatic.  Eyes:     General:        Right eye: No discharge.        Left eye: No discharge.   Cardiovascular:     Comments: Regular rate and rhythm.  S1/S2 are distinct without any evidence of murmur, rubs, or gallops.  Radial pulses are 2+ bilaterally.  Dorsalis pedis pulses are 2+ bilaterally.  No evidence of pedal edema. Pulmonary:     Comments: Clear to auscultation bilaterally.  Normal effort.  No respiratory distress.  No evidence of wheezes, rales, or rhonchi heard throughout. Abdominal:     General: Abdomen is flat. Bowel sounds are normal. There is no distension.     Tenderness: There  is no abdominal tenderness. There is no guarding or rebound.  Musculoskeletal:        General: Normal range of motion.     Cervical back: Neck supple.     Right lower leg: No edema.     Left lower leg: No edema.  Skin:    General: Skin is warm and dry.     Findings: No rash.  Neurological:     General: No focal deficit present.     Mental Status: She is alert.  Psychiatric:        Mood and Affect: Mood normal.        Behavior: Behavior normal.    ED Results / Procedures / Treatments   Labs (all labs ordered are listed, but only abnormal results are displayed) Labs Reviewed  COMPREHENSIVE METABOLIC PANEL - Abnormal; Notable for the following components:      Result Value   Sodium 133 (*)    Glucose, Bld 222 (*)    BUN 29 (*)    Total Protein 8.5 (*)    All other components within normal limits  CBC WITH DIFFERENTIAL/PLATELET - Abnormal; Notable for the following components:   Hemoglobin 11.2 (*)    HCT 34.5 (*)    All other components within normal limits  URINALYSIS, ROUTINE W REFLEX MICROSCOPIC - Abnormal; Notable for the following components:   APPearance HAZY (*)    Hgb urine dipstick MODERATE (*)    Leukocytes,Ua SMALL (*)    Bacteria, UA MANY (*)    All other components within normal limits  CBG MONITORING, ED - Abnormal; Notable for the following components:   Glucose-Capillary 202 (*)    All other components within normal limits    EKG EKG  Interpretation  Date/Time:  Saturday January 03 2021 08:22:03 EDT Ventricular Rate:  93 PR Interval:  149 QRS Duration: 53 QT Interval:  402 QTC Calculation: 500 R Axis:     Text Interpretation: [facility] Derived Leads Confirmed by Lorre Nick ([id number]) on 01/03/2021 9:37:11 AM  Radiology DG Chest Port 1 View  Result Date: 01/03/2021 CLINICAL DATA:  Syncopal episode. EXAM: PORTABLE CHEST 1 VIEW COMPARISON:  None. FINDINGS: The heart size and mediastinal contours are within normal limits. Both lungs are clear. The visualized skeletal structures are unremarkable. IMPRESSION: No active disease. Electronically Signed   By: Gerome Sam III M.D.   On: 01/03/2021 09:12    Procedures Procedures   Medications Ordered in ED Medications  lactated ringers bolus 1,000 mL (1,000 mLs Intravenous New Bag/Given 01/03/21 0914)  lactated ringers bolus 1,000 mL (1,000 mLs Intravenous New Bag/Given 01/03/21 1300)    ED Course  I have reviewed the triage vital signs and the nursing notes.  Pertinent labs & imaging results that were available during my care of the patient were reviewed by me and considered in my medical decision making (see chart for details).  Clinical Course as of 01/03/21 1525  Sat Jan 03, 2021  0900 I discussed this case with my attending physician who cosigned this note including patient's presenting symptoms, physical exam, and planned diagnostics and interventions. Attending physician stated agreement with plan or made changes to plan which were implemented.   Attending physician assessed patient at bedside.   [CF]  1240 Noted to be orthostatic positive. This is likely due to lack of intravascular volume. She is currently getting a second liter of LR.  [CF]  1339 2 liter of LR is running. Patient is feeling improved. Denies any chest  pain or shortness of breath.  [CF]    Clinical Course User Index [CF] Jolyn Lent   MDM Rules/Calculators/A&P                           Karla Mack is a 55 y.o. female who presents to the emergency department today for further evaluation of a syncopal episode.  Given the history, exam, and work-up I have a low suspicion for cardiac origin including heart failure or ACS.  I have a low suspicion that this is of neurological origin as there are no focal deficits or postictal symptoms on history or physical.    EKG is without arrhythmia.  CBC without leukocytosis.  Chronic and ongoing anemia which is improved from 2 months ago.  CMP reveals mild hyponatremia.  Adequately rehydrated in the department.  Slightly elevated glucose.  Patient is known diabetic.  UA is .  Chest x-ray is normal.  No overlying pneumonia.  A low suspicion for pulmonary embolism.  She has been chest pain and dyspnea free.  No clinical evidence of DVT at this time.  No recent surgery/immobilization. Her syncope is likely due to dehydration given she has been having diarrhea and lack of PO intake over the last few days. She is currently hemodynamically stable. Second liter of fluid is currently running. Once the fluid is finished running she is safe for discharge. Strict return precautions given. Instructed patient to drink plenty of fluids and her diarrhea is from COVID. Will have her follow up with her PCP in the next week.   Care transferred to Arthor Captain, PA-C pending discharge.     Final Clinical Impression(s) / ED Diagnoses Final diagnoses:  Syncope, unspecified syncope type    Rx / DC Orders ED Discharge Orders     None        Teressa Lower, PA-C 01/03/21 1523    Honor Loh Rivereno, PA-C 01/03/21 1525    Lorre Nick, MD 01/12/21 1540

## 2021-01-03 NOTE — ED Triage Notes (Signed)
Pt presents to the ED via EMS for a syncopal episode which occurred this morning PTA. Per EMS, the pt was sitting upright in bed and fell backwards and was unresponsive/ Per EMS, the pt lives with her daughter and daughter reports the syncopal episode lasted "a few seconds." Pt tested Covid positive three days ago and has accompanying of diarrhea and cough. The pt is a diabetic and has HTN. Hx provided by EMS, pt is arabic speaking.

## 2021-11-23 ENCOUNTER — Other Ambulatory Visit: Payer: Self-pay | Admitting: Internal Medicine

## 2021-11-23 DIAGNOSIS — Z1231 Encounter for screening mammogram for malignant neoplasm of breast: Secondary | ICD-10-CM

## 2021-12-08 ENCOUNTER — Ambulatory Visit
Admission: RE | Admit: 2021-12-08 | Discharge: 2021-12-08 | Disposition: A | Discharge status: Home / Self Care | Payer: Medicaid Other | Source: Ambulatory Visit | Attending: Internal Medicine | Admitting: Internal Medicine

## 2021-12-08 DIAGNOSIS — Z1231 Encounter for screening mammogram for malignant neoplasm of breast: Secondary | ICD-10-CM

## 2021-12-10 ENCOUNTER — Other Ambulatory Visit: Payer: Self-pay | Admitting: Internal Medicine

## 2021-12-10 DIAGNOSIS — R928 Other abnormal and inconclusive findings on diagnostic imaging of breast: Secondary | ICD-10-CM

## 2021-12-22 ENCOUNTER — Ambulatory Visit: Payer: Medicaid Other

## 2021-12-22 ENCOUNTER — Ambulatory Visit
Admission: RE | Admit: 2021-12-22 | Discharge: 2021-12-22 | Disposition: A | Discharge status: Home / Self Care | Payer: Medicaid Other | Source: Ambulatory Visit | Attending: Internal Medicine | Admitting: Internal Medicine

## 2021-12-22 DIAGNOSIS — R928 Other abnormal and inconclusive findings on diagnostic imaging of breast: Secondary | ICD-10-CM

## 2022-02-05 ENCOUNTER — Other Ambulatory Visit: Payer: Self-pay | Admitting: Internal Medicine

## 2022-02-06 LAB — URINE CULTURE
MICRO NUMBER:: 14080076
SPECIMEN QUALITY:: ADEQUATE

## 2022-02-06 LAB — SPECIMEN ID NOTIFICATION MISSING 2ND ID

## 2022-02-15 ENCOUNTER — Emergency Department (HOSPITAL_BASED_OUTPATIENT_CLINIC_OR_DEPARTMENT_OTHER): Payer: Medicaid Other | Admitting: Radiology

## 2022-02-15 ENCOUNTER — Encounter (HOSPITAL_BASED_OUTPATIENT_CLINIC_OR_DEPARTMENT_OTHER): Payer: Self-pay

## 2022-02-15 ENCOUNTER — Other Ambulatory Visit: Payer: Self-pay

## 2022-02-15 ENCOUNTER — Emergency Department (HOSPITAL_BASED_OUTPATIENT_CLINIC_OR_DEPARTMENT_OTHER)
Admission: EM | Admit: 2022-02-15 | Discharge: 2022-02-16 | Disposition: A | Discharge status: Home / Self Care | Payer: Medicaid Other | Attending: Emergency Medicine | Admitting: Emergency Medicine

## 2022-02-15 ENCOUNTER — Emergency Department (HOSPITAL_BASED_OUTPATIENT_CLINIC_OR_DEPARTMENT_OTHER): Payer: Medicaid Other

## 2022-02-15 DIAGNOSIS — Z7982 Long term (current) use of aspirin: Secondary | ICD-10-CM | POA: Insufficient documentation

## 2022-02-15 DIAGNOSIS — E119 Type 2 diabetes mellitus without complications: Secondary | ICD-10-CM | POA: Insufficient documentation

## 2022-02-15 DIAGNOSIS — M79604 Pain in right leg: Secondary | ICD-10-CM | POA: Diagnosis present

## 2022-02-15 DIAGNOSIS — Z7984 Long term (current) use of oral hypoglycemic drugs: Secondary | ICD-10-CM | POA: Insufficient documentation

## 2022-02-15 DIAGNOSIS — Z794 Long term (current) use of insulin: Secondary | ICD-10-CM | POA: Insufficient documentation

## 2022-02-15 DIAGNOSIS — G6289 Other specified polyneuropathies: Secondary | ICD-10-CM | POA: Insufficient documentation

## 2022-02-15 NOTE — ED Triage Notes (Addendum)
R ankle/calf pain and tingling since 2pm. Pt daughter reports that ankle is more swollen than normal. Pt speaks arabic. No injury reported.

## 2022-02-15 NOTE — ED Provider Notes (Signed)
[facility]  Provider Note  CSN: [id number] Arrival date & time: 02/15/22 1921  History Chief Complaint  Patient presents with   Leg Pain   History obtained with daughter at bedside translating, declined video interpreter Karla Mack Pami Wool is a 56 y.o. 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 She denies any falls or injuries. She is able to walk on it with some difficulty. No weakness. No facial droop or difficulty speaking. Daughter is concerned about possible DVT.    Home Medications Prior to Admission medications   Medication Sig Start Date End Date Taking? Authorizing Provider  ACCU-CHEK GUIDE test strip FOR TESTING 3 TIMES DAILY 11/13/18   [provider]  amLODipine (NORVASC) 10 MG tablet Take 1 tablet (10 mg total) by mouth daily. 01/04/13   Rai, Vernelle Emerald, MD  aspirin 81 MG tablet Take 1 tablet (81 mg total) by mouth daily. 01/04/13   Rai, Vernelle Emerald, MD  benzonatate (TESSALON) 100 MG capsule Take 1 capsule (100 mg total) by mouth every 8 (eight) hours. 03/07/19   Charlann Lange, PA-C  canagliflozin (INVOKANA) 300 MG TABS tablet Take 300 mg by mouth daily before breakfast.    [provider]  cetirizine (ZYRTEC) 10 MG tablet Take 1 tablet (10 mg total) by mouth daily as needed for allergies. 01/04/13   Rai, Vernelle Emerald, MD  diclofenac (VOLTAREN) 75 MG EC tablet TK 1 T PO BID WF PRN 11/03/18   [provider]  gabapentin (NEURONTIN) 300 MG capsule TK 1 C PO TID 11/03/18   [provider]  hydrochlorothiazide (HYDRODIURIL) 25 MG tablet Take 1 tablet (25 mg total) by mouth daily. 01/04/13   Rai, Ripudeep K, MD  insulin aspart (NOVOLOG) 100 UNIT/ML injection Inject 10-15 Units into the skin 3 (three) times daily before meals. 10 units in the morning  15 units at lunch 10 units at dinner 01/04/13   Rai, Ripudeep K, MD  Insulin Glargine (LANTUS SOLOSTAR) 100 UNIT/ML SOPN Inject 70 Units into the skin at bedtime. 01/31/13   Tresa Garter, MD  levonorgestrel (MIRENA) 20 MCG/24HR IUD 1 each by Intrauterine route once.    [provider]  lisinopril-hydrochlorothiazide (PRINZIDE,ZESTORETIC) 10-12.5 MG per tablet Take 1 tablet by mouth daily.    [provider]  magic mouthwash w/lidocaine SOLN Take 5 mLs by mouth 4 (four) times daily as needed for mouth pain. 03/07/19   Charlann Lange, PA-C  meloxicam (MOBIC) 7.5 MG tablet Take 7.5 mg by mouth daily.    [provider]  metFORMIN (GLUCOPHAGE) 1000 MG tablet Take 1 tablet (1,000 mg total) by mouth 2 (two) times daily with a meal. 01/04/13   Rai, Ripudeep K, MD  pravastatin (PRAVACHOL) 40 MG tablet Take 1 tablet (40 mg total) by mouth daily. 01/04/13   Rai, Vernelle Emerald, MD  simvastatin (ZOCOR) 40 MG tablet TK 1 T PO QD 09/14/18   [provider]  tiZANidine (ZANAFLEX) 4 MG tablet TAKE 1 TABLET BY MOUTH TWICE DAILY AS NEEDED FOR NECK PAINS 11/01/18   [provider]     Allergies    Octacosanol   Review of Systems   Review of Systems Please see HPI for pertinent positives and negatives  Physical Exam BP (!) 155/90   Pulse (!) 101   Temp (!) 97  F (36.1 C) (Temporal)   Resp 18   Ht 5\' 6"  (1.676 m)   Wt 65.3 kg   SpO2 99%   BMI 23.24 kg/m   Physical Exam Vitals and nursing note reviewed.  Constitutional:      Appearance: Normal appearance.  HENT:     Head: Normocephalic and atraumatic.     Nose: Nose normal.     Mouth/Throat:     Mouth: Mucous membranes are moist.  Eyes:     Extraocular Movements: Extraocular movements intact.     Conjunctiva/sclera: Conjunctivae normal.  Cardiovascular:     Rate and Rhythm: Normal rate.     Pulses: Normal pulses.  Pulmonary:     Effort: Pulmonary effort is normal.     Breath sounds: Normal  breath sounds.  Abdominal:     General: Abdomen is flat.     Palpations: Abdomen is soft.     Tenderness: There is no abdominal tenderness.  Musculoskeletal:        General: No tenderness or signs of injury. Normal range of motion.     Cervical back: Neck supple.     Right lower leg: Edema (trace) present.  Skin:    General: Skin is warm and dry.  Neurological:     General: No focal deficit present.     Mental Status: She is alert and oriented to person, place, and time.     Cranial Nerves: No cranial nerve deficit.     Sensory: No sensory deficit.     Motor: No weakness.  Psychiatric:        Mood and Affect: Mood normal.     ED Results / Procedures / Treatments   EKG None  Procedures Procedures  Medications Ordered in the ED Medications - No data to display  Initial Impression and Plan  Patient here with RLE pain and 'heaviness'. She does not have any focal neuro deficits, no sign of circulatory compromise. I personally viewed the images from radiology studies and agree with radiologist interpretation: Xrays are neg for bony injury. Doppler is neg for DVT.  No clear explanation for her symptoms although most likely a progression of her neuropathy. She is already scheduled to see PCP in 2 days, recommend she rest and elevated that extremity in the meantime and RTED for any worsening or concerning new symptoms.   ED Course       MDM Rules/Calculators/A&P Medical Decision Making Problems Addressed: Other polyneuropathy: chronic illness or injury with exacerbation, progression, or side effects of treatment  Amount and/or Complexity of Data Reviewed Radiology: ordered and independent interpretation performed. Decision-making details documented in ED Course.    Final Clinical Impression(s) / ED Diagnoses Final diagnoses:  Other polyneuropathy    Rx / DC Orders ED Discharge Orders     None        , MD 02/16/22 0005

## 2022-02-16 NOTE — ED Notes (Signed)
Pt verbalizes understanding of discharge instructions. Opportunity for questioning and answers were provided. Pt discharged from ED to home with daughter.    

## 2022-05-17 ENCOUNTER — Other Ambulatory Visit: Payer: Self-pay | Admitting: Internal Medicine

## 2022-05-19 LAB — URINE CULTURE
MICRO NUMBER:: 14486440
SPECIMEN QUALITY:: ADEQUATE

## 2022-05-19 LAB — C. TRACHOMATIS/N. GONORRHOEAE RNA
C. trachomatis RNA, TMA: NOT DETECTED
N. gonorrhoeae RNA, TMA: NOT DETECTED

## 2022-11-23 ENCOUNTER — Other Ambulatory Visit: Payer: Self-pay | Admitting: Internal Medicine

## 2022-11-23 LAB — LIPID PANEL
Cholesterol: 181 mg/dL (ref <200)
HDL: 44 mg/dL — ABNORMAL LOW (ref >=50)
LDL Cholesterol (Calc): 93 mg/dL (calc)
Non-HDL Cholesterol (Calc): 137 mg/dL (calc) — ABNORMAL HIGH (ref <130)
Total CHOL/HDL Ratio: 4.1 (calc) (ref <5.0)
Triglycerides: 331 mg/dL — ABNORMAL HIGH (ref <150)

## 2022-11-23 LAB — CBC
HCT: 32.3 % — ABNORMAL LOW (ref 35.0–45.0)
Hemoglobin: 10.8 g/dL — ABNORMAL LOW (ref 11.7–15.5)
MCH: 28.3 pg (ref 27.0–33.0)
MCHC: 33.4 g/dL (ref 32.0–36.0)
MCV: 84.6 fL (ref 80.0–100.0)
MPV: 8.9 fL (ref 7.5–12.5)
Platelets: 342 10*3/uL (ref 140–400)
RBC: 3.82 10*6/uL (ref 3.80–5.10)
RDW: 12.5 % (ref 11.0–15.0)
WBC: 8.2 10*3/uL (ref 3.8–10.8)

## 2022-11-23 LAB — COMPLETE METABOLIC PANEL WITH GFR
AG Ratio: 1.4 (calc) (ref 1.0–2.5)
ALT: 10 U/L (ref 6–29)
AST: 11 U/L (ref 10–35)
Albumin: 4.3 g/dL (ref 3.6–5.1)
Alkaline phosphatase (APISO): 84 U/L (ref 37–153)
BUN/Creatinine Ratio: 33 (calc) — ABNORMAL HIGH (ref 6–22)
BUN: 50 mg/dL — ABNORMAL HIGH (ref 7–25)
CO2: 19 mmol/L — ABNORMAL LOW (ref 20–32)
Calcium: 9.7 mg/dL (ref 8.6–10.4)
Chloride: 99 mmol/L (ref 98–110)
Creat: 1.51 mg/dL — ABNORMAL HIGH (ref 0.50–1.03)
Globulin: 3 g/dL (calc) (ref 1.9–3.7)
Glucose, Bld: 127 mg/dL — ABNORMAL HIGH (ref 65–99)
Potassium: 5.4 mmol/L — ABNORMAL HIGH (ref 3.5–5.3)
Sodium: 134 mmol/L — ABNORMAL LOW (ref 135–146)
Total Bilirubin: 0.3 mg/dL (ref 0.2–1.2)
Total Protein: 7.3 g/dL (ref 6.1–8.1)
eGFR: 40 mL/min/{1.73_m2} — ABNORMAL LOW (ref >=60)

## 2022-11-23 LAB — TSH: TSH: 2.07 mIU/L (ref 0.40–4.50)

## 2022-11-23 LAB — FOLATE: Folate: 24 ng/mL

## 2022-11-23 LAB — VITAMIN D 25 HYDROXY (VIT D DEFICIENCY, FRACTURES): Vit D, 25-Hydroxy: 44 ng/mL (ref 30–100)

## 2022-11-23 LAB — VITAMIN B12: Vitamin B-12: 632 pg/mL (ref 200–1100)

## 2022-11-30 ENCOUNTER — Other Ambulatory Visit: Payer: Self-pay | Admitting: Internal Medicine

## 2022-11-30 DIAGNOSIS — Z0000 Encounter for general adult medical examination without abnormal findings: Secondary | ICD-10-CM

## 2022-12-10 ENCOUNTER — Ambulatory Visit: Admission: RE | Admit: 2022-12-10 | Payer: Medicaid Other | Source: Ambulatory Visit

## 2022-12-10 DIAGNOSIS — Z0000 Encounter for general adult medical examination without abnormal findings: Secondary | ICD-10-CM

## 2023-10-10 ENCOUNTER — Other Ambulatory Visit (HOSPITAL_COMMUNITY)
Admission: RE | Admit: 2023-10-10 | Discharge: 2023-10-10 | Disposition: A | Discharge status: Home / Self Care | Source: Ambulatory Visit | Attending: Advanced Practice Midwife | Admitting: Advanced Practice Midwife

## 2023-10-10 ENCOUNTER — Encounter: Payer: Self-pay | Admitting: Advanced Practice Midwife

## 2023-10-10 ENCOUNTER — Ambulatory Visit: Admitting: Advanced Practice Midwife

## 2023-10-10 VITALS — BP 112/68 | HR 103 | Wt 145.0 lb

## 2023-10-10 DIAGNOSIS — Z01419 Encounter for gynecological examination (general) (routine) without abnormal findings: Secondary | ICD-10-CM | POA: Diagnosis not present

## 2023-10-10 DIAGNOSIS — Z603 Acculturation difficulty: Secondary | ICD-10-CM

## 2023-10-10 DIAGNOSIS — Z30432 Encounter for removal of intrauterine contraceptive device: Secondary | ICD-10-CM

## 2023-10-10 DIAGNOSIS — Z758 Other problems related to medical facilities and other health care: Secondary | ICD-10-CM | POA: Diagnosis not present

## 2023-10-10 DIAGNOSIS — Z124 Encounter for screening for malignant neoplasm of cervix: Secondary | ICD-10-CM | POA: Diagnosis present

## 2023-10-10 NOTE — Progress Notes (Addendum)
 Subjective:     Karla Mack is a 58 y.o. female here at [facility] for a routine exam. Current complaints: none. Desires IUD removal today. Personal and family health history reviewed: yes.  Do you have a primary care provider? Yes  Health Maintenance Due  Topic Date Due   FOOT EXAM  Never done   OPHTHALMOLOGY EXAM  Never done   Diabetic kidney evaluation - Urine ACR  Never done   Hepatitis C Screening  Never done   DTaP/Tdap/Td (1 - Tdap) Never done   Pneumococcal Vaccine 45-83 Years old (1 of 2 - PCV) Never done   Colonoscopy  Never done   HEMOGLOBIN A1C  07/04/2013   Zoster Vaccines- Shingrix (1 of 2) Never done   Cervical Cancer Screening (HPV/Pap Cotest)  04/04/2016   COVID-19 Vaccine (1 - 2024-25 season) Never done     Risk factors for chronic health problems: Pt BMI: Body mass index is 23.4 kg/m.   Gynecologic History LMP was shortly after IUD insertion Contraception: IUD in place for 9 years Last Pap: 2014 NILM, neg HPV.  Obstetric History OB History  Gravida Para Term Preterm AB Living  4 4 4   4   SAB IAB Ectopic Multiple Live Births          # Outcome Date GA Lbr Len/2nd Weight Sex Type Anes PTL Lv  4 Term           3 Term           2 Term           1 Term             The following portions of the patient's history were reviewed and updated as appropriate: allergies, current medications, past family history, past medical history, past surgical history, and problem list.  Review of Systems Pertinent items are noted in HPI.    Objective:   Today's Vitals   10/10/23 1410  BP: 112/68  Pulse: (!) 103  Weight: 65.8 kg   Body mass index is 23.4 kg/m.  VS reviewed, nursing note reviewed,  Constitutional: well developed, well nourished, no distress HEENT: normocephalic, thyroid without enlargement or mass HEART: RRR, no murmurs rubs/gallops RESP: clear and equal to auscultation bilaterally in all lobes  Breast Exam: right breast  normal without mass, skin or nipple changes or axillary nodes, left breast normal without mass, skin or nipple changes or axillary nodes, encouraged self breast exam Abdomen: soft Neuro: alert and oriented x 3 Skin: warm, dry Psych: affect normal Pelvic exam: Cervix pink, visually closed, without lesion, scant white creamy discharge, vaginal walls and external genitalia normal Bimanual exam: neg CMT, uterus nontender, nonenlarged, adnexa without tenderness, enlargement, or mass    Assessment/Plan:   1. Encounter for annual routine gynecological examination Continue yearly mammogram, due in August Recommended colposcopy, would like PCP to order No symptoms of menopause  2. Screening for cervical cancer (Primary) - Cytology - PAP( [place])   3. Encounter for IUD removal  Patient was in the dorsal lithotomy position, normal external genitalia was noted.  A speculum was placed in the patient's vagina, normal discharge was noted, no lesions. The multiparous cervix was visualized, no lesions, no abnormal discharge.  The strings of the IUD were grasped and pulled using ring forceps. The IUD was removed in its entirety. Patient tolerated the procedure well. No contraception needed as she is likely postmenopausal, will report if periods return.   4.  Language barrier affecting health care --Arabic interpreter # [phone] used for all communication.    No follow-ups on file.   Vernell FORBES Ruddle, Student-MidWife 5:49 PM    Midwife Attestation:  I personally saw and evaluated the patient, performing the key elements of the service. I developed and verified the management plan that is described in the resident's/student's note, and I agree with the content with my edits above. VSS, HRR&R, Resp unlabored, Legs neg.    Olam Boards, CNM 6:13 PM

## 2023-10-10 NOTE — Progress Notes (Signed)
 Pt presents for IUD removal.  Last PAP 2014.

## 2023-10-12 LAB — CYTOLOGY - PAP
Adequacy: ABSENT
Comment: NEGATIVE
Diagnosis: REACTIVE
High risk HPV: NEGATIVE

## 2023-10-15 ENCOUNTER — Ambulatory Visit: Payer: Self-pay | Admitting: Advanced Practice Midwife

## 2023-12-06 ENCOUNTER — Other Ambulatory Visit: Payer: Self-pay | Admitting: Internal Medicine

## 2023-12-06 DIAGNOSIS — Z1231 Encounter for screening mammogram for malignant neoplasm of breast: Secondary | ICD-10-CM

## 2023-12-20 ENCOUNTER — Ambulatory Visit
Admission: RE | Admit: 2023-12-20 | Discharge: 2023-12-20 | Disposition: A | Discharge status: Home / Self Care | Source: Ambulatory Visit | Attending: Internal Medicine | Admitting: Internal Medicine

## 2023-12-20 DIAGNOSIS — Z1231 Encounter for screening mammogram for malignant neoplasm of breast: Secondary | ICD-10-CM

## 2024-03-19 ENCOUNTER — Encounter: Payer: Self-pay | Admitting: Physician Assistant

## 2024-04-30 NOTE — Progress Notes (Unsigned)
 "    05/01/2024 Karla Mack Nalah Macioce [id number] 07-16-1965  Referring provider: Jegede, Olugbemiga E, MD/[facility] Primary GI doctor: Dr. Federico  ASSESSMENT AND PLAN:  Intermittent diarrhea Can have BM daily or every 2 days, formed or loose No family history of colon cancer, never had colonoscopy Denies blood in stool, AB pain, weight loss -Will schedule for colonoscopy to evaluate for cancer, microscopic colitis, We have discussed the risks of bleeding, infection, perforation, medication reactions, and remote risk of death associated with colonoscopy. All questions were answered and the patient acknowledges these risk and wishes to proceed. -Add on benefiber, given diet information/FODMAP, avoid lactulose -Possible pelvic floor component, consider follow up  GERD On diclofenac 75 mg twice daily On famotidine  20 mg twice daily On Trulicity since 2022 Denies reflux/GERD/dysphagia -Consider H pylori testing if there is an anemia  Normocytic anemia Previous decrease ferritin in 2022 of 17 11/23/2022  HGB 10.8 MCV 84.6 Platelets 342 B12 632 12/05/2023 Hgb 11, platelets 349, thyroid 1.97 -Check iron , ferritin, B12  Type 2 diabetes with severe retinopathy/CKD/neuropathy Discussed GLP1 with the patient, mechanism of action. Gastroparesis diet given to the patient.  Patient should be instructed to hold this medications if dose falls within 7 days of endoscopic procedure, due to increased risk of retained gastric contents.   Patient Care Team: Jegede, Olugbemiga E, MD as PCP - General (Internal Medicine)  HISTORY OF PRESENT ILLNESS: 59 y.o. arabic speaking female with a past medical history listed below presents as a new patient for evaluation of intermittent diarrhea.   Discussed the use of AI scribe software for clinical note transcription with the patient, who gave verbal consent to proceed.  History of Present Illness   Karla Mack Hedaya Latendresse is a 59  year old female with diabetes who presents for evaluation of intermittent loose stools and anemia.  Intermittent episodes of loose stools or diarrhea alternate with normal stools. No history of hard stools. Bowel movements occur daily or every other day. Denies blood in stool, melena, abdominal pain, or discomfort. No weight loss, though weight fluctuates at times. No nausea, vomiting, heartburn, or reflux symptoms.  No prior colonoscopy or endoscopy. No family history of colon cancer.  Low hemoglobin was documented in August 2024 and August 2025, and in 2022. She is not taking iron  supplements and has not received prior counseling regarding anemia. Denies fatigue or restless legs.  Diabetes is managed with metformin , insulin , and once-weekly Trulicity. No new medications in the past year. Diet includes meat. No history of arm surgery.        She  reports that she has never smoked. She has never used smokeless tobacco. She reports that she does not drink alcohol and does not use drugs.  RELEVANT GI HISTORY, IMAGING AND LABS: Results   Labs Hemoglobin (11/2023): Low, decreased from low in 2022 Iron  (2022): Low      CBC    Component Value Date/Time   WBC 8.2 11/23/2022 0638   RBC 3.82 11/23/2022 0638   HGB 10.8 (L) 11/23/2022 0638   HCT 32.3 (L) 11/23/2022 0638   PLT 342 11/23/2022 0638   MCV 84.6 11/23/2022 0638   MCH 28.3 11/23/2022 0638   MCHC 33.4 11/23/2022 0638   RDW 12.5 11/23/2022 0638   LYMPHSABS 2.1 01/03/2021 0821   MONOABS 0.5 01/03/2021 0821   EOSABS 0.1 01/03/2021 0821   BASOSABS 0.0 01/03/2021 0821   No results for input(s): HGB in the last 8760 hours.  CMP  Component Value Date/Time   NA 134 (L) 11/23/2022 0638   K 5.4 (H) 11/23/2022 0638   CL 99 11/23/2022 0638   CO2 19 (L) 11/23/2022 0638   GLUCOSE 127 (H) 11/23/2022 0638   BUN 50 (H) 11/23/2022 0638   CREATININE 1.51 (H) 11/23/2022 0638   CALCIUM 9.7 11/23/2022 0638   PROT 7.3 11/23/2022 0638    ALBUMIN 3.7 01/03/2021 0821   AST 11 11/23/2022 0638   ALT 10 11/23/2022 0638   ALKPHOS 96 01/03/2021 0821   BILITOT 0.3 11/23/2022 0638   GFRNONAA >60 01/03/2021 0821   GFRAA >60 01/07/2020 1559      Latest Ref Rng & Units 11/23/2022    6:38 AM 01/03/2021    8:21 AM 11/03/2020   12:00 AM  Hepatic Function  Total Protein 6.1 - 8.1 g/dL 7.3  8.5  7.6   Albumin 3.5 - 5.0 g/dL  3.7    AST 10 - 35 U/L 11  17  14    ALT 6 - 29 U/L 10  16  11    Alk Phosphatase 38 - 126 U/L  96    Total Bilirubin 0.2 - 1.2 mg/dL 0.3  0.4  0.3       Current Medications:   Current Outpatient Medications (Endocrine & Metabolic):    canagliflozin (INVOKANA) 300 MG TABS tablet, Take 300 mg by mouth daily before breakfast.   Dulaglutide (TRULICITY) 1.5 MG/0.5ML SOAJ, INJECT 1.5 MG UNDER THE SKIN ONCE WEEKLY   FARXIGA 10 MG TABS tablet, Take 10 mg by mouth daily.   insulin  aspart (NOVOLOG ) 100 UNIT/ML injection, Inject 10-15 Units into the skin 3 (three) times daily before meals. 10 units in the morning 15 units at lunch 10 units at dinner   Insulin  Glargine (LANTUS  SOLOSTAR) 100 UNIT/ML SOPN, Inject 70 Units into the skin at bedtime.   levonorgestrel (MIRENA) 20 MCG/24HR IUD, 1 each by Intrauterine route once.   metFORMIN  (GLUCOPHAGE ) 1000 MG tablet, Take 1 tablet (1,000 mg total) by mouth 2 (two) times daily with a meal.  Current Outpatient Medications (Cardiovascular):    amLODipine  (NORVASC ) 10 MG tablet, Take 1 tablet (10 mg total) by mouth daily.   hydrochlorothiazide  (HYDRODIURIL ) 25 MG tablet, Take 1 tablet (25 mg total) by mouth daily.   lisinopril-hydrochlorothiazide  (PRINZIDE,ZESTORETIC) 10-12.5 MG per tablet, Take 1 tablet by mouth daily.   pravastatin  (PRAVACHOL ) 40 MG tablet, Take 1 tablet (40 mg total) by mouth daily.   simvastatin (ZOCOR) 40 MG tablet, TK 1 T PO QD  Current Outpatient Medications (Respiratory):    benzonatate  (TESSALON ) 100 MG capsule, Take 1 capsule (100 mg total) by mouth  every 8 (eight) hours.   cetirizine  (ZYRTEC ) 10 MG tablet, Take 1 tablet (10 mg total) by mouth daily as needed for allergies.   magic mouthwash w/lidocaine  SOLN*, Take 5 mLs by mouth 4 (four) times daily as needed for mouth pain.  Current Outpatient Medications (Analgesics):    aspirin  81 MG tablet, Take 1 tablet (81 mg total) by mouth daily.   diclofenac (VOLTAREN) 75 MG EC tablet, TK 1 T PO BID WF PRN   meloxicam (MOBIC) 7.5 MG tablet, Take 7.5 mg by mouth daily.  Current Outpatient Medications (Other):    ACCU-CHEK GUIDE test strip, FOR TESTING 3 TIMES DAILY   famotidine  (PEPCID ) 20 MG tablet, Take 20 mg by mouth 2 (two) times daily.   gabapentin (NEURONTIN) 300 MG capsule, TK 1 C PO TID   magic mouthwash w/lidocaine  SOLN*, Take 5  mLs by mouth 4 (four) times daily as needed for mouth pain.   Na Sulfate-K Sulfate-Mg Sulfate concentrate (SUPREP BOWEL PREP KIT) 17.5-3.13-1.6 GM/177ML SOLN, Take 1 kit (354 mLs total) by mouth as directed.   tiZANidine (ZANAFLEX) 4 MG tablet, TAKE 1 TABLET BY MOUTH TWICE DAILY AS NEEDED FOR NECK PAINS  * These medications belong to multiple therapeutic classes and are listed under each applicable group.  Medical History:  Past Medical History:  Diagnosis Date   Diabetes mellitus    Hypertension    Allergies: Allergies[1]   Surgical History:  She  has no past surgical history on file. Family History:  Her family history is not on file.  REVIEW OF SYSTEMS  : All other systems reviewed and negative except where noted in the History of Present Illness.  PHYSICAL EXAM: BP (!) 160/86   Pulse 76   Ht 5' 6 (1.676 m)   Wt 154 lb 6 oz (70 kg)   BMI 24.92 kg/m  Physical Exam   GENERAL APPEARANCE: Well nourished, in no apparent distress. HEENT: No cervical lymphadenopathy, unremarkable thyroid, sclerae anicteric, conjunctiva pink. RESPIRATORY: Respiratory effort normal, BS equal bilateral without rales, rhonchi, wheezing. CARDIO: RRR with no MRGs,  peripheral pulses intact. ABDOMEN: Soft, non-distended, active bowel sounds in all 4 quadrants, non-tender to palpation, no rebound, no mass appreciated. RECTAL: Declines. MUSCULOSKELETAL: Full ROM, normal gait, without edema. SKIN: Dry, intact without rashes or lesions. No jaundice. NEURO: Alert, oriented, no focal deficits. PSYCH: Cooperative, normal mood and affect. NECK: No abnormalities noted.      Alan JONELLE Coombs, PA-C 3:30 PM      [1]  Allergies Allergen Reactions   Octacosanol     Itchy eyes, runny nose, sneezing   "

## 2024-05-01 ENCOUNTER — Encounter: Payer: Self-pay | Admitting: Physician Assistant

## 2024-05-01 ENCOUNTER — Other Ambulatory Visit

## 2024-05-01 ENCOUNTER — Ambulatory Visit: Admitting: Physician Assistant

## 2024-05-01 DIAGNOSIS — D649 Anemia, unspecified: Secondary | ICD-10-CM

## 2024-05-01 DIAGNOSIS — R195 Other fecal abnormalities: Secondary | ICD-10-CM

## 2024-05-01 DIAGNOSIS — K219 Gastro-esophageal reflux disease without esophagitis: Secondary | ICD-10-CM | POA: Diagnosis not present

## 2024-05-01 DIAGNOSIS — R197 Diarrhea, unspecified: Secondary | ICD-10-CM | POA: Diagnosis not present

## 2024-05-01 LAB — CBC WITH DIFFERENTIAL/PLATELET
Basophils Absolute: 0 K/uL (ref 0.0–0.1)
Basophils Relative: 0.5 % (ref 0.0–3.0)
Eosinophils Absolute: 0.1 K/uL (ref 0.0–0.7)
Eosinophils Relative: 1.9 % (ref 0.0–5.0)
HCT: 33.3 % — ABNORMAL LOW (ref 36.0–46.0)
Hemoglobin: 11 g/dL — ABNORMAL LOW (ref 12.0–15.0)
Lymphocytes Relative: 39.7 % (ref 12.0–46.0)
Lymphs Abs: 2.7 K/uL (ref 0.7–4.0)
MCHC: 32.9 g/dL (ref 30.0–36.0)
MCV: 83 fl (ref 78.0–100.0)
Monocytes Absolute: 0.4 K/uL (ref 0.1–1.0)
Monocytes Relative: 6 % (ref 3.0–12.0)
Neutro Abs: 3.5 K/uL (ref 1.4–7.7)
Neutrophils Relative %: 51.9 % (ref 43.0–77.0)
Platelets: 313 K/uL (ref 150.0–400.0)
RBC: 4.01 Mil/uL (ref 3.87–5.11)
RDW: 13.9 % (ref 11.5–15.5)
WBC: 6.7 K/uL (ref 4.0–10.5)

## 2024-05-01 LAB — COMPREHENSIVE METABOLIC PANEL WITH GFR
ALT: 11 U/L (ref 3–35)
AST: 13 U/L (ref 5–37)
Albumin: 4.2 g/dL (ref 3.5–5.2)
Alkaline Phosphatase: 65 U/L (ref 39–117)
BUN: 28 mg/dL — ABNORMAL HIGH (ref 6–23)
CO2: 28 meq/L (ref 19–32)
Calcium: 9.9 mg/dL (ref 8.4–10.5)
Chloride: 99 meq/L (ref 96–112)
Creatinine, Ser: 0.73 mg/dL (ref 0.40–1.20)
GFR: 89.89 mL/min
Glucose, Bld: 120 mg/dL — ABNORMAL HIGH (ref 70–99)
Potassium: 4.6 meq/L (ref 3.5–5.1)
Sodium: 138 meq/L (ref 135–145)
Total Bilirubin: 0.3 mg/dL (ref 0.2–1.2)
Total Protein: 7.4 g/dL (ref 6.0–8.3)

## 2024-05-01 LAB — IBC + FERRITIN
Ferritin: 11.9 ng/mL (ref 10.0–291.0)
Iron: 53 ug/dL (ref 42–145)
Saturation Ratios: 12.5 % — ABNORMAL LOW (ref 20.0–50.0)
TIBC: 424.2 ug/dL (ref 250.0–450.0)
Transferrin: 303 mg/dL (ref 212.0–360.0)

## 2024-05-01 MED ORDER — NA SULFATE-K SULFATE-MG SULF 17.5-3.13-1.6 GM/177ML PO SOLN: 1.0000 | ORAL | 0 refills | Status: AC

## 2024-05-01 NOTE — Patient Instructions (Addendum)
 Your provider has requested that you go to the basement level for lab work before leaving today. Press B on the elevator. The lab is located at the first door on the left as you exit the elevator.  You have been scheduled for a colonoscopy. Please follow written instructions given to you at your visit today.   If you use inhalers (even only as needed), please bring them with you on the day of your procedure.  DO NOT TAKE 7 DAYS PRIOR TO TEST- Trulicity (dulaglutide) Ozempic, Wegovy (semaglutide) Mounjaro, Zepbound (tirzepatide) Bydureon Bcise (exanatide extended release)  DO NOT TAKE 1 DAY PRIOR TO YOUR TEST Rybelsus (semaglutide) Adlyxin (lixisenatide) Victoza (liraglutide) Byetta (exanatide) ___________________________________________________________________________   Due to recent changes in healthcare laws, you may see the results of your imaging and laboratory studies on MyChart before your provider has had a chance to review them.  We understand that in some cases there may be results that are confusing or concerning to you. Not all laboratory results come back in the same time frame and the provider may be waiting for multiple results in order to interpret others.  Please give us  48 hours in order for your provider to thoroughly review all the results before contacting the office for clarification of your results.    FIBER SUPPLEMENT You can do metamucil or fibercon once or twice a day but if this causes gas/bloating please switch to Benefiber or Citracel.  Fiber is good for constipation/diarrhea/irritable bowel syndrome.  It can also help with weight loss and can help lower your bad cholesterol (LDL).  Please do 1 TBSP in the morning in water, coffee, or tea.  It can take up to a month before you can see a difference with your bowel movements.  It is cheapest from [employer], [employer], [employer].   Toileting tips to help with your constipation - Drink at least 64-80 ounces of  water/liquid per day. - Establish a time to try to move your bowels every day.  For many people, this is after a cup of coffee or after a meal such as breakfast. - Sit all of the way back on the toilet keeping your back fairly straight and while sitting up, try to rest the tops of your forearms on your upper thighs.   - Raising your feet with a step stool/squatty potty can be helpful to improve the angle that allows your stool to pass through the rectum. - Relax the rectum feeling it bulge toward the toilet water.  If you feel your rectum raising toward your body, you are contracting rather than relaxing. - Breathe in and slowly exhale. Belly breath by expanding your belly towards your belly button. Keep belly expanded as you gently direct pressure down and back to the anus.  A low pitched GRRR sound can assist with increasing intra-abdominal pressure.  (Can also trying to blow on a pinwheel and make it move, this helps with the same belly breathing) - Repeat 3-4 times. If unsuccessful, contract the pelvic floor to restore normal tone and get off the toilet.  Avoid excessive straining. - To reduce excessive wiping by teaching your anus to normally contract, place hands on outer aspect of knees and resist knee movement outward.  Hold 5-10 second then place hands just inside of knees and resist inward movement of knees.  Hold 5 seconds.  Repeat a few times each way.  Go to the ER if unable to pass gas, severe AB pain, unable to hold down food,  any shortness of breath of chest pain.

## 2024-05-02 ENCOUNTER — Ambulatory Visit: Payer: Self-pay | Admitting: Physician Assistant

## 2024-05-02 DIAGNOSIS — D649 Anemia, unspecified: Secondary | ICD-10-CM

## 2024-05-02 DIAGNOSIS — K219 Gastro-esophageal reflux disease without esophagitis: Secondary | ICD-10-CM

## 2024-05-02 MED ORDER — IRON 325 (65 FE) MG PO TABS: 325.0000 mg | ORAL_TABLET | Freq: Two times a day (BID) | ORAL | 1 refills | Status: AC

## 2024-05-04 NOTE — Telephone Encounter (Signed)
 Inbound call from patient son stating that he is returning a call back for his mother. Patient is requesting a call back.Please advise.

## 2024-05-08 ENCOUNTER — Ambulatory Visit: Admitting: Internal Medicine

## 2024-05-08 ENCOUNTER — Encounter: Payer: Self-pay | Admitting: Internal Medicine

## 2024-05-08 VITALS — BP 132/80 | HR 88 | Temp 97.4°F | Resp 11 | Ht 66.0 in | Wt 154.0 lb

## 2024-05-08 DIAGNOSIS — D509 Iron deficiency anemia, unspecified: Secondary | ICD-10-CM

## 2024-05-08 DIAGNOSIS — R197 Diarrhea, unspecified: Secondary | ICD-10-CM | POA: Diagnosis not present

## 2024-05-08 DIAGNOSIS — R195 Other fecal abnormalities: Secondary | ICD-10-CM

## 2024-05-08 DIAGNOSIS — K648 Other hemorrhoids: Secondary | ICD-10-CM | POA: Diagnosis not present

## 2024-05-08 DIAGNOSIS — Z1211 Encounter for screening for malignant neoplasm of colon: Secondary | ICD-10-CM | POA: Diagnosis not present

## 2024-05-08 DIAGNOSIS — D122 Benign neoplasm of ascending colon: Secondary | ICD-10-CM | POA: Diagnosis not present

## 2024-05-08 MED ORDER — SODIUM CHLORIDE 0.9 % IV SOLN: 500.0000 mL | Freq: Once | INTRAVENOUS | Status: AC

## 2024-05-08 NOTE — Progress Notes (Signed)
 Called to room to assist during endoscopic procedure.  Patient ID and intended procedure confirmed with present staff. Received instructions for my participation in the procedure from the performing physician.

## 2024-05-08 NOTE — Patient Instructions (Addendum)
-  Await pathology results -Follow up appointment in 2-3 months (please call office to schedule this)  YOU HAD AN ENDOSCOPIC PROCEDURE TODAY AT THE [facility]:   Refer to the procedure report that was given to you for any specific questions about what was found during the examination.  If the procedure report does not answer your questions, please call your gastroenterologist to clarify.  If you requested that your care partner not be given the details of your procedure findings, then the procedure report has been included in a sealed envelope for you to review at your convenience later.  YOU SHOULD EXPECT: Some feelings of bloating in the abdomen. Passage of more gas than usual.  Walking can help get rid of the air that was put into your GI tract during the procedure and reduce the bloating. If you had a lower endoscopy (such as a colonoscopy or flexible sigmoidoscopy) you may notice spotting of blood in your stool or on the toilet paper. If you underwent a bowel prep for your procedure, you may not have a normal bowel movement for a few days.  Please Note:  You might notice some irritation and congestion in your nose or some drainage.  This is from the oxygen used during your procedure.  There is no need for concern and it should clear up in a day or so.  SYMPTOMS TO REPORT IMMEDIATELY:  Following lower endoscopy (colonoscopy or flexible sigmoidoscopy):  Excessive amounts of blood in the stool  Significant tenderness or worsening of abdominal pains  Swelling of the abdomen that is new, acute  Fever of 100F or higher  For urgent or emergent issues, a gastroenterologist can be reached at any hour by calling [phone]. Do not use MyChart messaging for urgent concerns.    DIET:  We do recommend a small meal at first, but then you may proceed to your regular diet.  Drink plenty of fluids but you should avoid alcoholic beverages for 24 hours.  ACTIVITY:  You should plan to take  it easy for the rest of today and you should NOT DRIVE or use heavy machinery until tomorrow (because of the sedation medicines used during the test).    FOLLOW UP: Our staff will call the number listed on your records the next business day following your procedure.  We will call around 7:15- 8:00 am to check on you and address any questions or concerns that you may have regarding the information given to you following your procedure. If we do not reach you, we will leave a message.     If any biopsies were taken you will be contacted by phone or by letter within the next 1-3 weeks.  Please call us  at [phone] if you have not heard about the biopsies in 3 weeks.    SIGNATURES/CONFIDENTIALITY: You and/or your care partner have signed paperwork which will be entered into your electronic medical record.  These signatures attest to the fact that that the information above on your After Visit Summary has been reviewed and is understood.  Full responsibility of the confidentiality of this discharge information lies with you and/or your care-partner.

## 2024-05-08 NOTE — Progress Notes (Signed)
 "   GASTROENTEROLOGY PROCEDURE H&P NOTE   Primary Care Physician: Jegede, Olugbemiga E, MD    Reason for Procedure:   Colon cancer screening, diarrhea, IDA  Plan:    Colonoscopy  Patient is appropriate for endoscopic procedure(s) in the ambulatory ([facility]) setting.  The nature of the procedure, as well as the risks, benefits, and alternatives were carefully and thoroughly reviewed with the patient. Ample time for discussion and questions allowed. The patient understood, was satisfied, and agreed to proceed.     HPI: Karla Mack is a 59 y.o. female who presents for colonoscopy for evaluation of colon cancer screening, diarrhea, IDA.  Patient was most recently seen in the Gastroenterology Clinic on 05/01/24.  No interval change in medical history since that appointment. Please refer to that note for full details regarding GI history and clinical presentation.   Past Medical History:  Diagnosis Date   Diabetes mellitus    Hypertension     No past surgical history on file.  Prior to Admission medications  Medication Sig Start Date End Date Taking? Authorizing Provider  ACCU-CHEK GUIDE test strip FOR TESTING 3 TIMES DAILY 11/13/18   [provider]  amLODipine  (NORVASC ) 10 MG tablet Take 1 tablet (10 mg total) by mouth daily. 01/04/13   Rai, Nydia POUR, MD  aspirin  81 MG tablet Take 1 tablet (81 mg total) by mouth daily. 01/04/13   Rai, Ripudeep POUR, MD  benzonatate  (TESSALON ) 100 MG capsule Take 1 capsule (100 mg total) by mouth every 8 (eight) hours. 03/07/19   Odell Balls, PA-C  canagliflozin (INVOKANA) 300 MG TABS tablet Take 300 mg by mouth daily before breakfast.    [provider]  cetirizine  (ZYRTEC ) 10 MG tablet Take 1 tablet (10 mg total) by mouth daily as needed for allergies. 01/04/13   Rai, Nydia POUR, MD  diclofenac (VOLTAREN) 75 MG EC tablet TK 1 T PO BID WF PRN 11/03/18   [provider]  Dulaglutide (TRULICITY) 1.5 MG/0.5ML SOAJ  INJECT 1.5 MG UNDER THE SKIN ONCE WEEKLY 04/25/20   [provider]  famotidine  (PEPCID ) 20 MG tablet Take 20 mg by mouth 2 (two) times daily.    [provider]  FARXIGA 10 MG TABS tablet Take 10 mg by mouth daily.    [provider]  Ferrous Sulfate (IRON ) 325 (65 Fe) MG TABS Take 1 tablet (325 mg total) by mouth 2 (two) times daily. Take with OJ or vitamin C 05/02/24   Craig Alan SAUNDERS, PA-C  gabapentin (NEURONTIN) 300 MG capsule TK 1 C PO TID 11/03/18   [provider]  hydrochlorothiazide  (HYDRODIURIL ) 25 MG tablet Take 1 tablet (25 mg total) by mouth daily. 01/04/13   Rai, Nydia POUR, MD  insulin  aspart (NOVOLOG ) 100 UNIT/ML injection Inject 10-15 Units into the skin 3 (three) times daily before meals. 10 units in the morning 15 units at lunch 10 units at dinner 01/04/13   Rai, Nydia POUR, MD  Insulin  Glargine (LANTUS  SOLOSTAR) 100 UNIT/ML SOPN Inject 70 Units into the skin at bedtime. 01/31/13   Jegede, Olugbemiga E, MD  levonorgestrel (MIRENA) 20 MCG/24HR IUD 1 each by Intrauterine route once.    [provider]  lisinopril-hydrochlorothiazide  (PRINZIDE,ZESTORETIC) 10-12.5 MG per tablet Take 1 tablet by mouth daily.    [provider]  magic mouthwash w/lidocaine  SOLN Take 5 mLs by mouth 4 (four) times daily as needed for mouth pain. 03/07/19   Odell Balls, PA-C  meloxicam (MOBIC) 7.5 MG  tablet Take 7.5 mg by mouth daily.    [provider]  metFORMIN  (GLUCOPHAGE ) 1000 MG tablet Take 1 tablet (1,000 mg total) by mouth 2 (two) times daily with a meal. 01/04/13   Rai, Ripudeep K, MD  Na Sulfate-K Sulfate-Mg Sulfate concentrate (SUPREP BOWEL PREP KIT) 17.5-3.13-1.6 GM/177ML SOLN Take 1 kit (354 mLs total) by mouth as directed. 05/01/24   Craig Alan SAUNDERS, PA-C  pravastatin  (PRAVACHOL ) 40 MG tablet Take 1 tablet (40 mg total) by mouth daily. 01/04/13   Rai, Ripudeep K, MD  simvastatin (ZOCOR) 40 MG tablet TK 1 T PO QD 09/14/18   [provider]  tiZANidine (ZANAFLEX) 4 MG tablet TAKE 1 TABLET BY MOUTH TWICE DAILY AS NEEDED FOR NECK PAINS 11/01/18   [provider]    Current Outpatient Medications  Medication Sig Dispense Refill   ACCU-CHEK GUIDE test strip FOR TESTING 3 TIMES DAILY     amLODipine  (NORVASC ) 10 MG tablet Take 1 tablet (10 mg total) by mouth daily. 30 tablet 4   aspirin  81 MG tablet Take 1 tablet (81 mg total) by mouth daily. 30 tablet 4   benzonatate  (TESSALON ) 100 MG capsule Take 1 capsule (100 mg total) by mouth every 8 (eight) hours. 21 capsule 0   canagliflozin (INVOKANA) 300 MG TABS tablet Take 300 mg by mouth daily before breakfast.     cetirizine  (ZYRTEC ) 10 MG tablet Take 1 tablet (10 mg total) by mouth daily as needed for allergies. 30 tablet 4   diclofenac (VOLTAREN) 75 MG EC tablet TK 1 T PO BID WF PRN     Dulaglutide (TRULICITY) 1.5 MG/0.5ML SOAJ INJECT 1.5 MG UNDER THE SKIN ONCE WEEKLY     famotidine  (PEPCID ) 20 MG tablet Take 20 mg by mouth 2 (two) times daily.     FARXIGA 10 MG TABS tablet Take 10 mg by mouth daily.     Ferrous Sulfate (IRON ) 325 (65 Fe) MG TABS Take 1 tablet (325 mg total) by mouth 2 (two) times daily. Take with OJ or vitamin C 60 tablet 1   gabapentin (NEURONTIN) 300 MG capsule TK 1 C PO TID     hydrochlorothiazide  (HYDRODIURIL ) 25 MG tablet Take 1 tablet (25 mg total) by mouth daily. 30 tablet 4   insulin  aspart (NOVOLOG ) 100 UNIT/ML injection Inject 10-15 Units into the skin 3 (three) times daily before meals. 10 units in the morning 15 units at lunch 10 units at dinner 5 pen 5   Insulin  Glargine (LANTUS  SOLOSTAR) 100 UNIT/ML SOPN Inject 70 Units into the skin at bedtime. 5 pen 5   levonorgestrel (MIRENA) 20 MCG/24HR IUD 1 each by Intrauterine route once.     lisinopril-hydrochlorothiazide  (PRINZIDE,ZESTORETIC) 10-12.5 MG per tablet Take 1 tablet by mouth daily.     magic mouthwash w/lidocaine  SOLN Take 5 mLs by mouth 4 (four) times daily as needed for mouth  pain. 100 mL 0   meloxicam (MOBIC) 7.5 MG tablet Take 7.5 mg by mouth daily.     metFORMIN  (GLUCOPHAGE ) 1000 MG tablet Take 1 tablet (1,000 mg total) by mouth 2 (two) times daily with a meal. 60 tablet 3   Na Sulfate-K Sulfate-Mg Sulfate concentrate (SUPREP BOWEL PREP KIT) 17.5-3.13-1.6 GM/177ML SOLN Take 1 kit (354 mLs total) by mouth as directed. 324 mL 0   pravastatin  (PRAVACHOL ) 40 MG tablet Take 1 tablet (40 mg total) by mouth daily. 30 tablet 4   simvastatin (ZOCOR) 40 MG tablet TK 1 T PO QD  tiZANidine (ZANAFLEX) 4 MG tablet TAKE 1 TABLET BY MOUTH TWICE DAILY AS NEEDED FOR NECK PAINS     Current Facility-Administered Medications  Medication Dose Route Frequency Provider Last Rate Last Admin   0.9 %  sodium chloride  infusion  500 mL Intravenous Once Federico Rosario BROCKS, MD        Allergies as of 05/08/2024 - Review Complete 05/08/2024  Allergen Reaction Noted   Octacosanol Other (See Comments) 07/06/2011    Family History  Problem Relation Age of Onset   Breast cancer Neg Hx     Social History   Socioeconomic History   Marital status: Married    Spouse name: Not on file   Number of children: Not on file   Years of education: Not on file   Highest education level: Not on file  Occupational History   Not on file  Tobacco Use   Smoking status: Never   Smokeless tobacco: Never  Substance and Sexual Activity   Alcohol use: No   Drug use: No   Sexual activity: Yes    Birth control/protection: I.U.D.  Other Topics Concern   Not on file  Social History Narrative   Not on file   Social Drivers of Health   Tobacco Use: Low Risk (04/24/2024)   Received from [facility]   Patient History    Smoking Tobacco Use: Never    Smokeless Tobacco Use: Never    Passive Exposure: Not on file  Financial Resource Strain: Not on file  Food Insecurity: Not on file  Transportation Needs: Not on file  Physical Activity: Not on file  Stress: Not on file  Social Connections: Not on  file  Intimate Partner Violence: Not on file  Depression (EYV7-0): Not on file  Alcohol Screen: Not on file  Housing: Not on file  Utilities: Not on file  Health Literacy: Not on file    Physical Exam: Vital signs in last 24 hours: BP (!) 180/97   Pulse (!) 109   Temp (!) 97.4 F (36.3 C) (Temporal)   Ht 5' 6 (1.676 m)   Wt 154 lb (69.9 kg)   SpO2 100%   BMI 24.86 kg/m  GEN: NAD EYE: Sclerae anicteric ENT: MMM CV: Non-tachycardic Pulm: No increased WOB GI: Soft NEURO:  Alert & Oriented   Estefana Federico, MD [facility]   05/08/2024 2:36 PM  "

## 2024-05-08 NOTE — Progress Notes (Signed)
 Interpreter used today at the [facility] for this pt.  Interpreter's name is-  French  Pt's states no medical or surgical changes since previsit or office visit.

## 2024-05-08 NOTE — Progress Notes (Signed)
 Sedate, gd SR, tolerated procedure well, VSS, report to RN

## 2024-05-08 NOTE — Op Note (Signed)
 [facility] Patient Name: Karla Mack Procedure Date: 05/08/2024 3:07 PM MRN: [id number] Endoscopist: Rosario Estefana Kidney , , [id number] Age: 59 Referring MD:  Date of Birth: [birth year] Gender: Female Account #: [id number] Procedure:                Colonoscopy Indications:              Screening for colorectal malignant neoplasm, This                            is the patient's first colonoscopy, Incidental -                            Diarrhea, Incidental - Iron  deficiency anemia Medicines:                Monitored Anesthesia Care Procedure:                Pre-Anesthesia Assessment:                           - Prior to the procedure, a History and Physical                            was performed, and patient medications and                            allergies were reviewed. The patient's tolerance of                            previous anesthesia was also reviewed. The risks                            and benefits of the procedure and the sedation                            options and risks were discussed with the patient.                            All questions were answered, and informed consent                            was obtained. Prior Anticoagulants: The patient has                            taken no anticoagulant or antiplatelet agents. ASA                            Grade Assessment: II - A patient with mild systemic                            disease. After reviewing the risks and benefits,                            the patient was deemed in satisfactory condition to  undergo the procedure.                           After obtaining informed consent, the colonoscope                            was passed under direct vision. Throughout the                            procedure, the patient's blood pressure, pulse, and                            oxygen saturations were monitored continuously. The                            Olympus  Scope [id number] was introduced through the                            anus and advanced to the the cecum, identified by                            appendiceal orifice and ileocecal valve. The                            colonoscopy was performed without difficulty. The                            patient tolerated the procedure well. The quality                            of the bowel preparation was excellent. The                            ileocecal valve, appendiceal orifice, and rectum                            were photographed. Scope In: 3:19:21 PM Scope Out: 3:38:58 PM Scope Withdrawal Time: 0 hours 12 minutes 58 seconds  Total Procedure Duration: 0 hours 19 minutes 37 seconds  Findings:                 Two sessile polyps were found in the ascending                            colon. The polyps were 3 to 6 mm in size. These                            polyps were removed with a cold snare. Resection                            and retrieval were complete.                           Non-bleeding internal hemorrhoids were found during  retroflexion.                           Biopsies for histology were taken with a cold                            forceps from the entire colon for evaluation of                            microscopic colitis. Complications:            No immediate complications. Estimated Blood Loss:     Estimated blood loss was minimal. Impression:               - Two 3 to 6 mm polyps in the ascending colon,                            removed with a cold snare. Resected and retrieved.                           - Non-bleeding internal hemorrhoids.                           - Biopsies were taken with a cold forceps from the                            entire colon for evaluation of microscopic colitis. Recommendation:           - Discharge patient to home (with escort).                           - Await pathology results.                            - Return to GI clinic in 2-3 months.                           - The findings and recommendations were discussed                            with the patient. Dr Estefana Federico Rosario Estefana Federico,  05/08/2024 3:43:33 PM

## 2024-05-09 ENCOUNTER — Telehealth: Payer: Self-pay | Admitting: *Deleted

## 2024-05-09 NOTE — Telephone Encounter (Signed)
" °  Follow up Call-     05/08/2024    2:27 PM  Call back number  Post procedure Call Back phone  # [phone]  Permission to leave phone message Yes     Patient questions:  Do you have a fever, pain , or abdominal swelling? No. Pain Score  0 *  Have you tolerated food without any problems? Yes.    Have you been able to return to your normal activities? Yes.    Do you have any questions about your discharge instructions: Diet   No. Medications  No. Follow up visit  No.  Do you have questions or concerns about your Care? No.  Actions: * If pain score is 4 or above: No action needed, pain <4.   "

## 2024-05-13 LAB — SURGICAL PATHOLOGY

## 2024-05-14 ENCOUNTER — Ambulatory Visit: Payer: Self-pay | Admitting: Internal Medicine
